# Patient Record
Sex: Female | Born: 1935 | Race: White | Hispanic: No | Marital: Married
Health system: Midwestern US, Academic
[De-identification: ages and names within clinical notes are randomized; demographics above are authoritative.]

---

## 2016-11-22 IMAGING — CR LOW_EXM
3 series · 3 of 3 positions shown · non-contrast
Comparison: 20 October, 2013

EXAM: Left knee
INDICATION: Left knee pain
TECHNIQUE: Three-view

[knee ap]
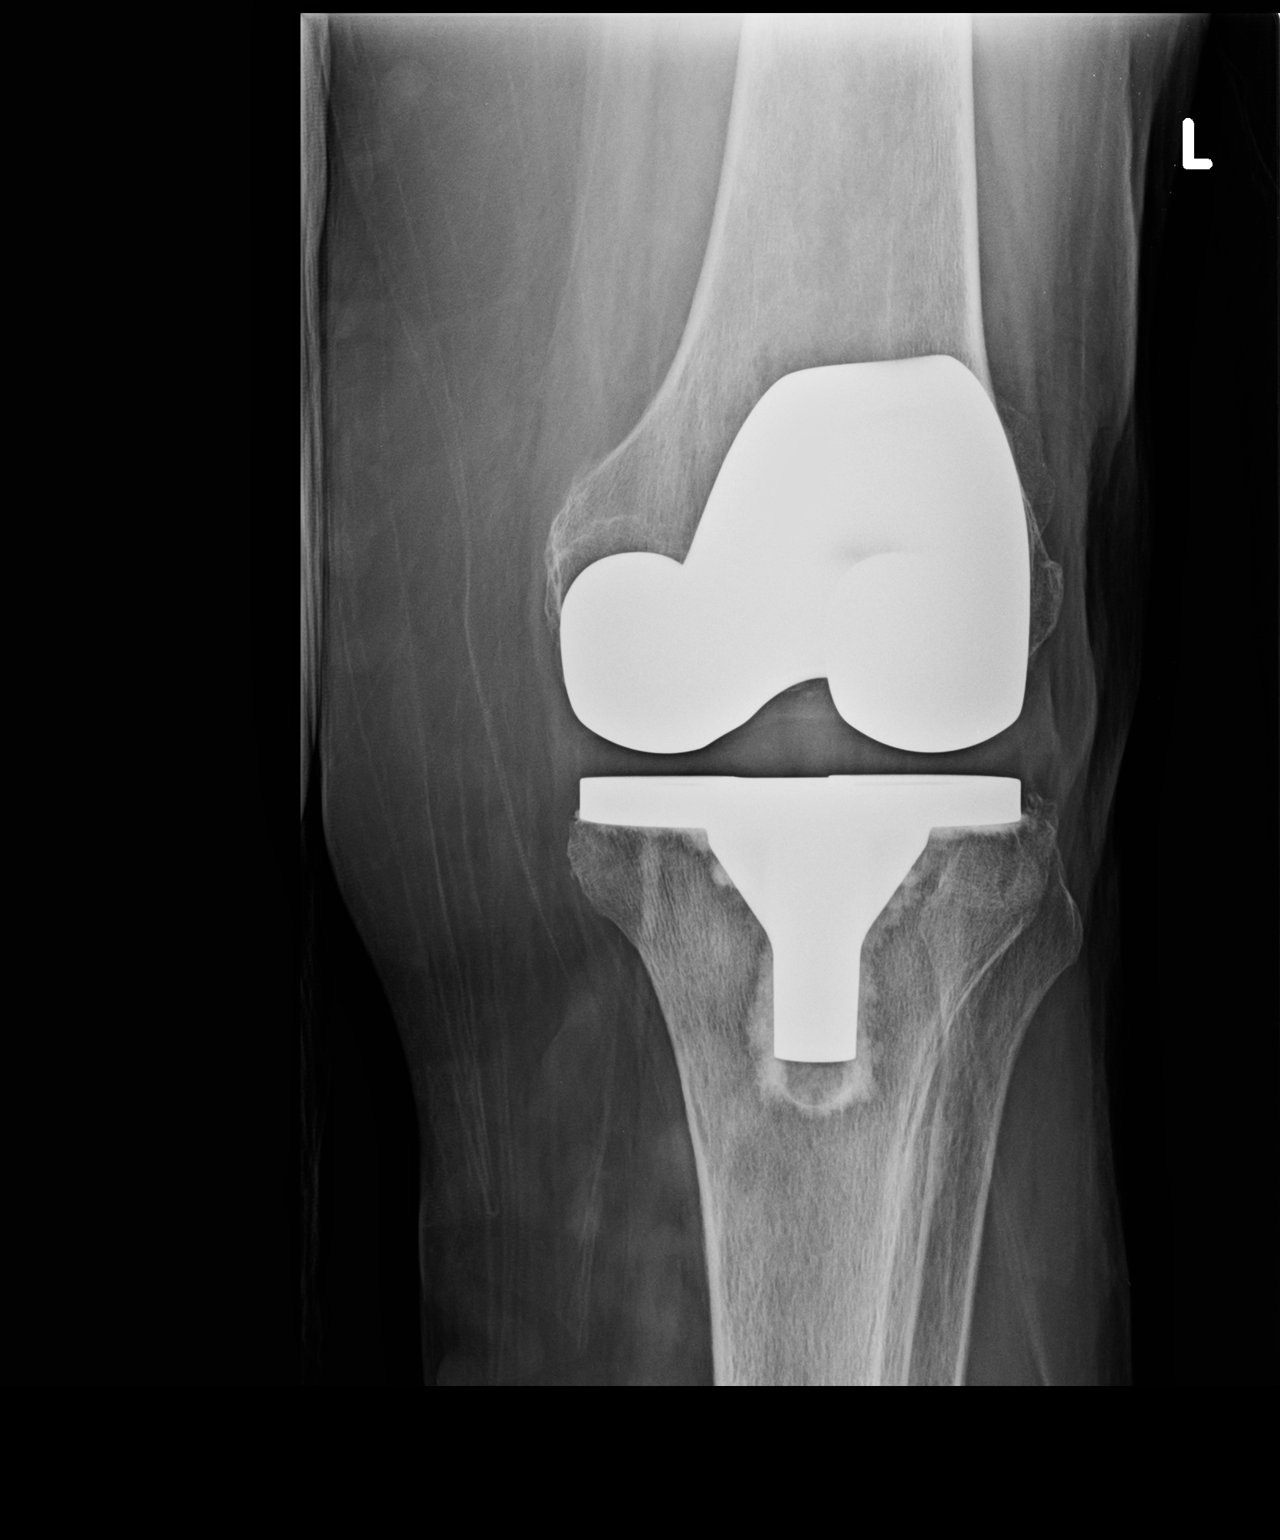

[knee lat]
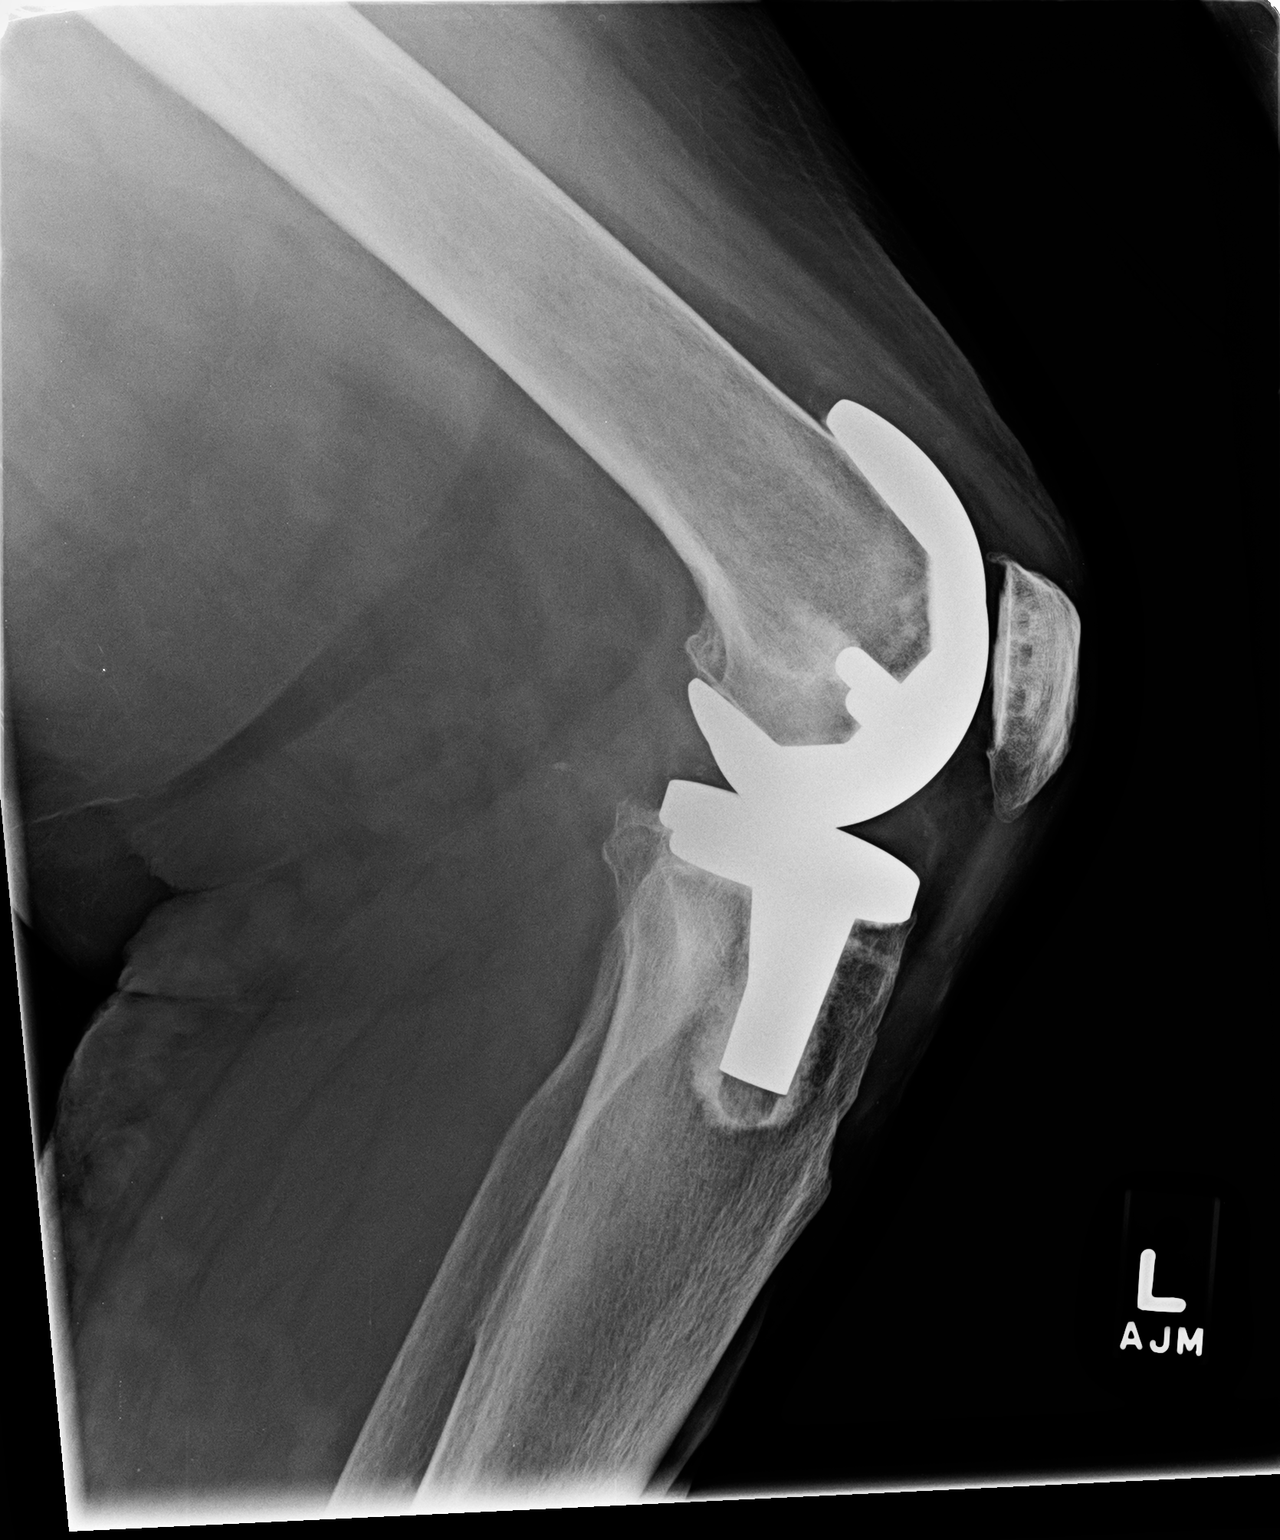

[knee sunrise]
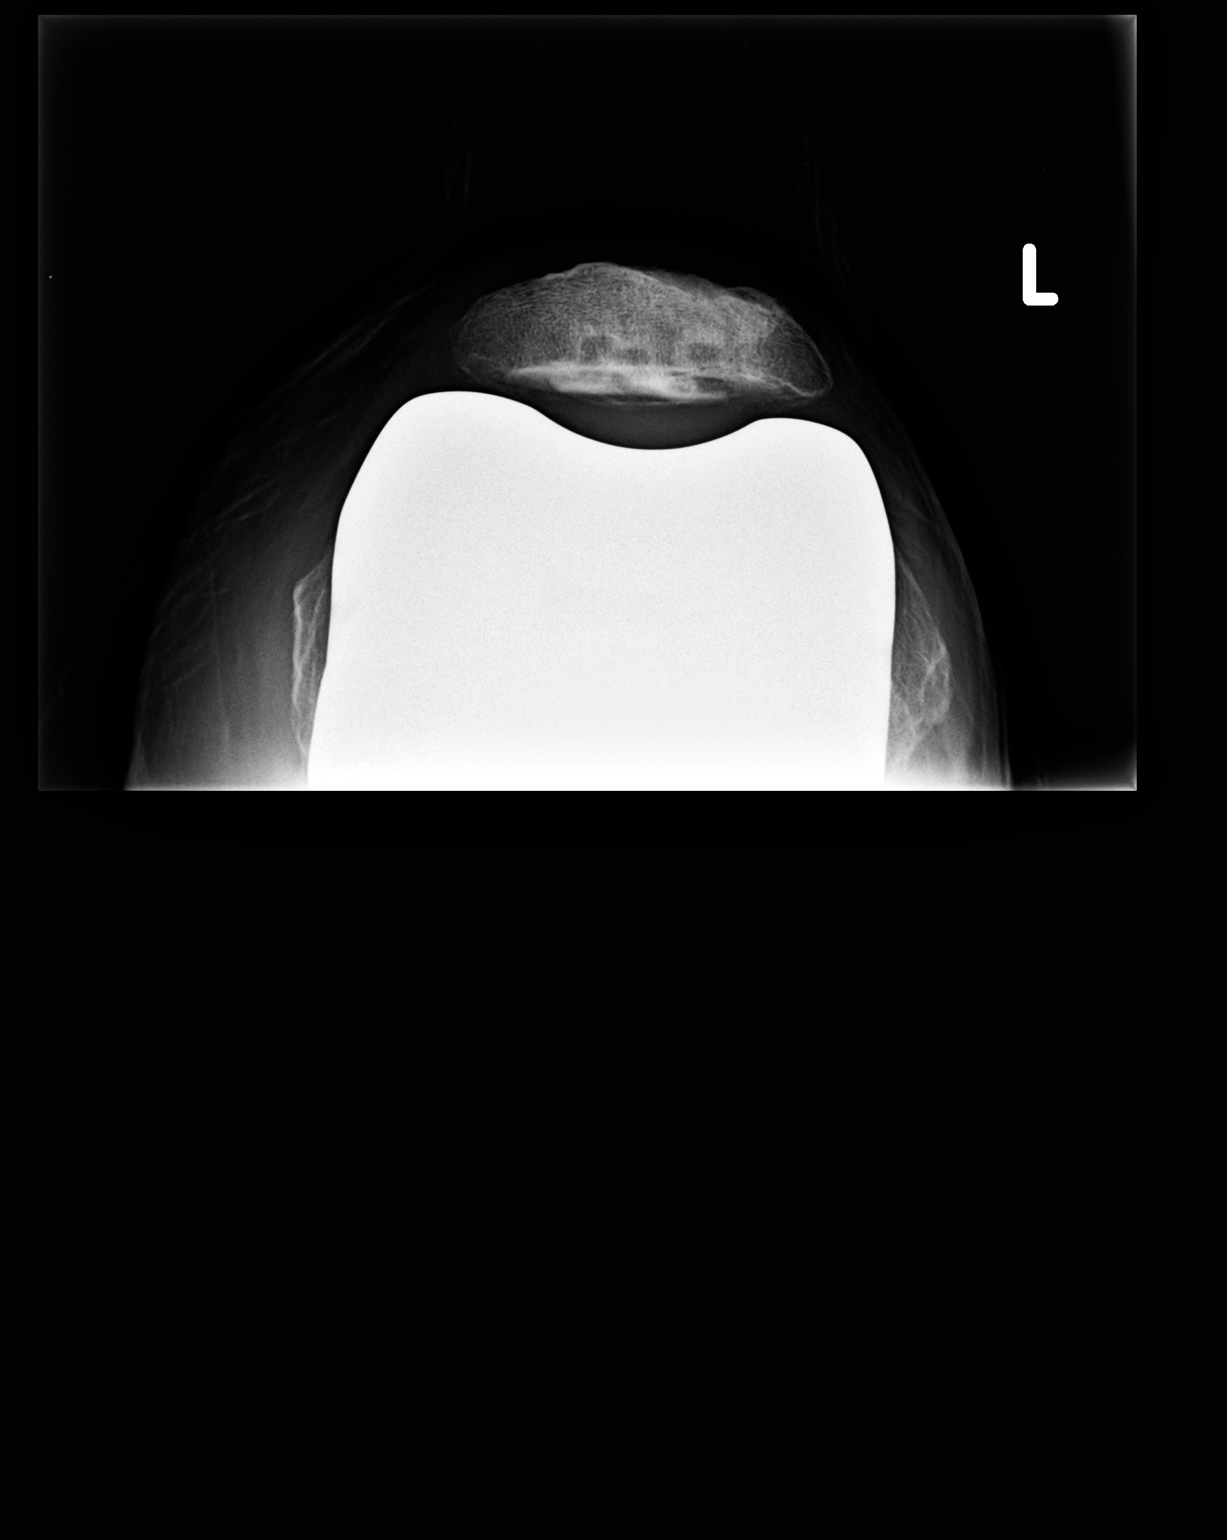

[3 of 3 positions shown; findings below may reference images not displayed]

IMPRESSION: Total left knee arthroplasty without acute bony or soft tissue abnormality.
FINDINGS: There is a total left knee prosthesis.  Hardware is stable in position and
alignment.  No signs for hardware loosening.  No effusion.  No fracture.
Patella is midline.

Dictated by Rigo, Clint
Preliminary report until reviewed and verified by Up, Ahmada

Tech Notes: KNEE PAIN X 1 MONTH AFTER PULLING WEEDS. PAIN RADIATES UP TO HIP. AB

## 2016-11-22 IMAGING — CR PELVIS
2 series · 2 of 2 positions shown · non-contrast
Comparison: None

EXAM: Left hip
INDICATION: Left hip pain
TECHNIQUE: AP, lateral

[hip ap]
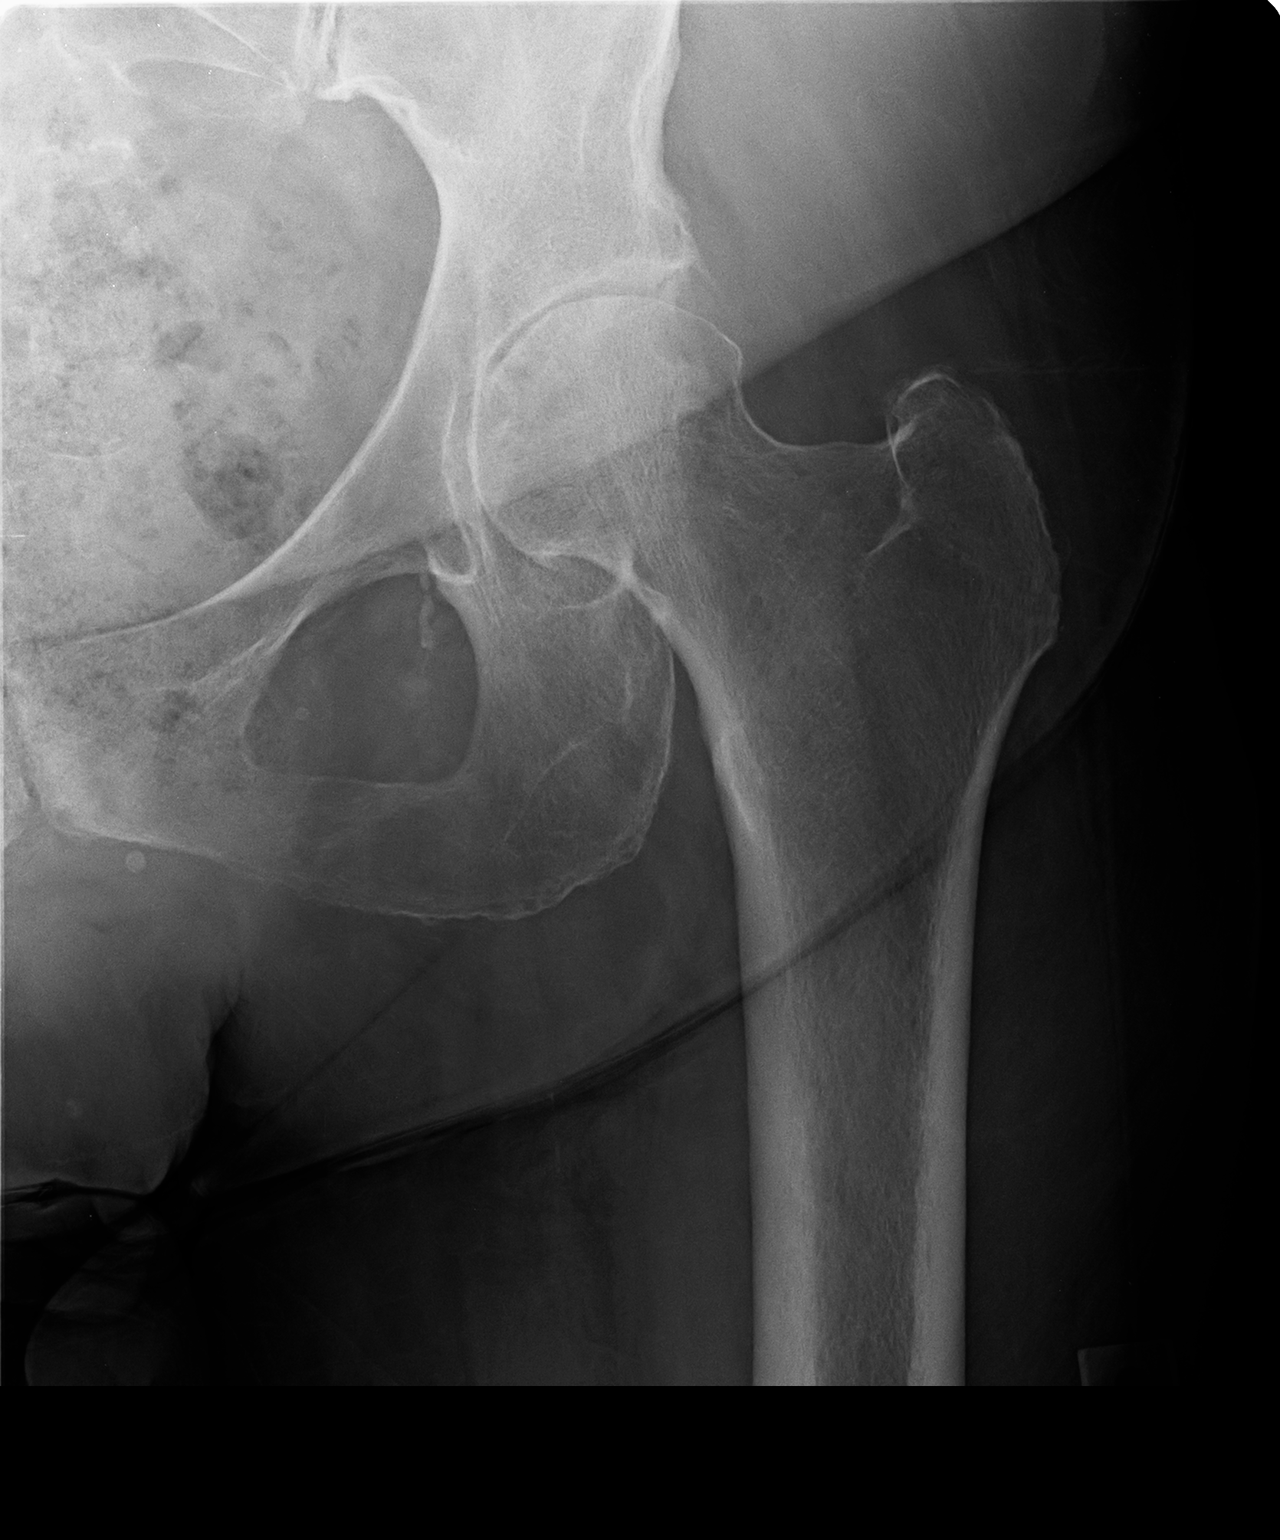

[hip frog]
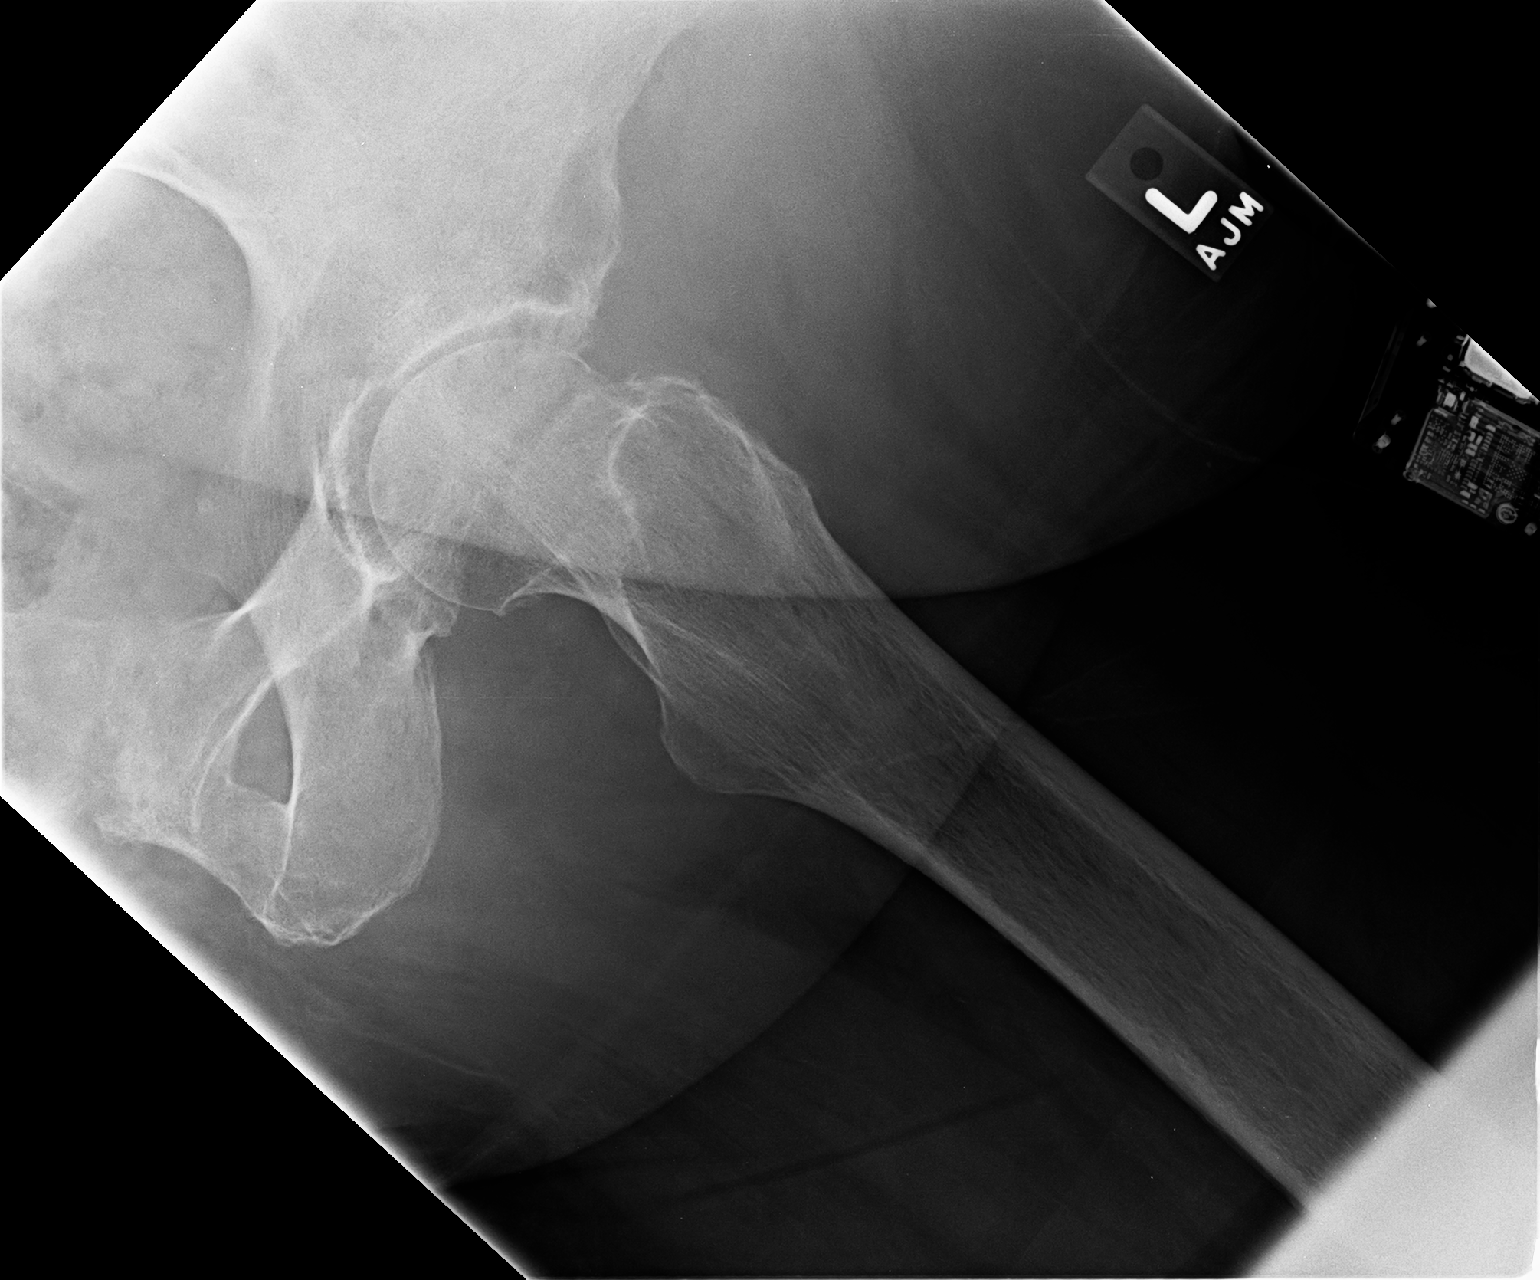

[2 of 2 positions shown; findings below may reference images not displayed]

IMPRESSION: Moderate osteoarthritic degenerative change without acute bony or soft tissue
abnormality.
FINDINGS: There is loss of joint space height.  There is mild osteophyte formation
superior lateral acetabulum.  No fracture.  No dislocation.  Visualized left
hemipelvis is unremarkable.  Soft tissues unremarkable.

Dictated by Nelson, Ihuaku
Preliminary report until reviewed and verified by Arslan, Combe

Tech Notes: KNEE PAIN X 1 MONTH AFTER PULLING WEEDS. PAIN RADIATES UP TO HIP. AB

## 2016-12-26 IMAGING — MR Hips^ROUTINE
6 series · 42 of 48 positions shown · non-contrast
Comparison: Plain film February 06, 2015.

MRI REPORT

MRI LEFT HIP
INDICATIONS:  Atypical left hip pain.
TECHNIQUE: Multiple standard sagittal, axial and coronal reference planes
without contrast.

[Series 2: T1 · coronal · 4.0mm · 0.78mm/px · 8 of 24 slices shown (1 of 4)]
[im 1/24]
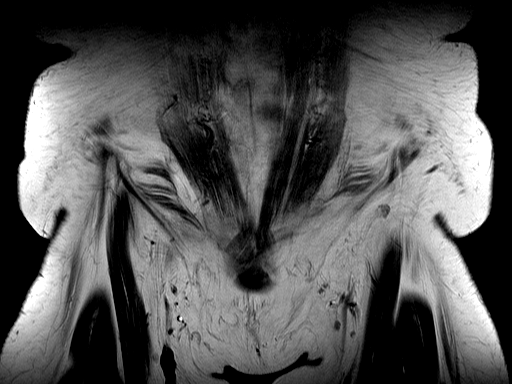
[im 4/24]
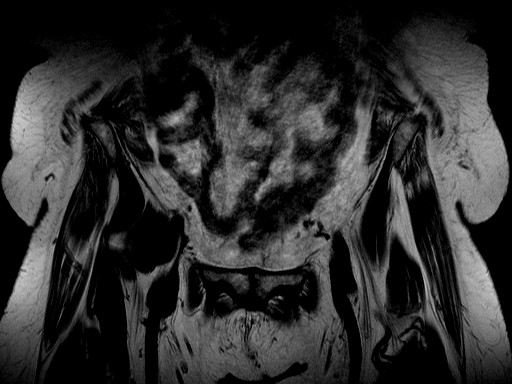
[im 7/24]
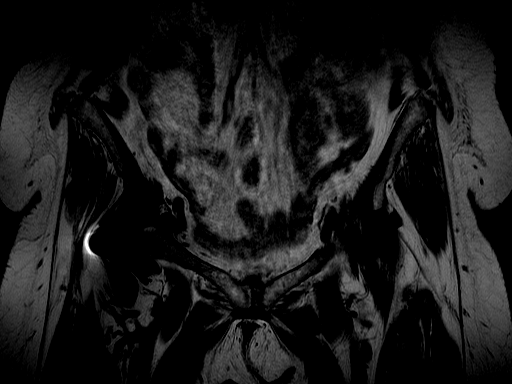
[im 10/24]
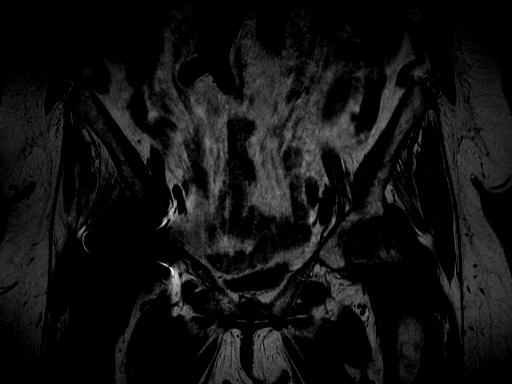
[im 14/24]
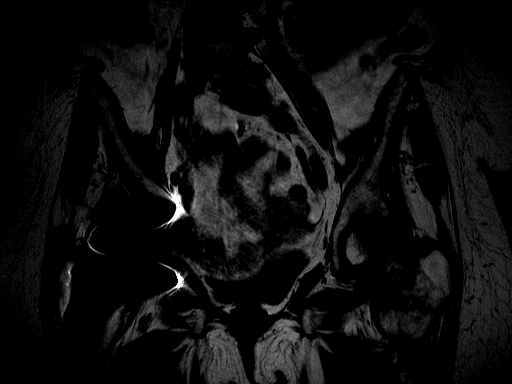
[im 17/24]
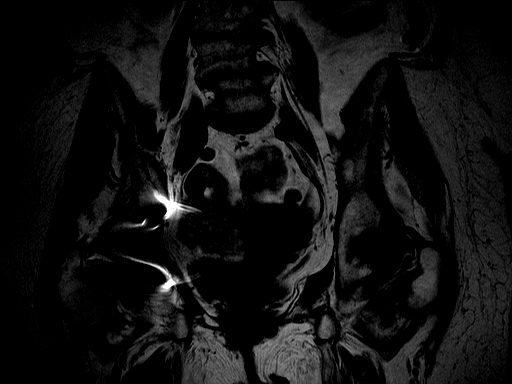
[im 20/24]
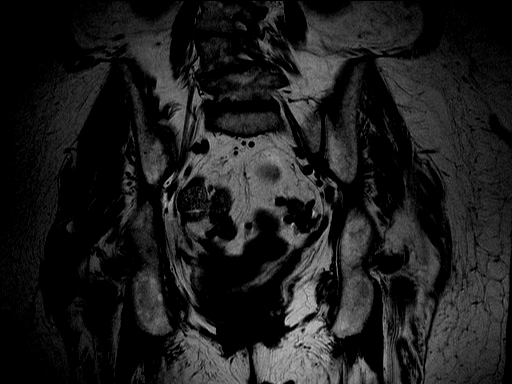
[im 24/24]
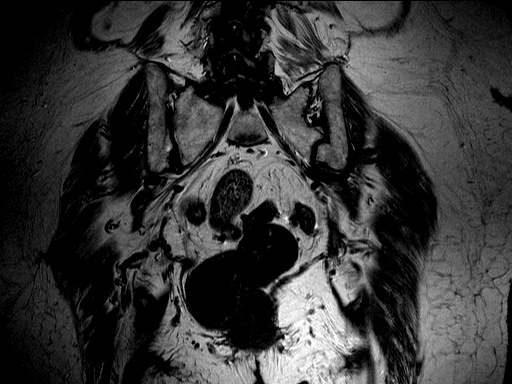

[Series 3: STIR · coronal · 4.0mm · 1.25mm/px · 8 of 24 slices shown (1 of 2)]
[im 1/24]
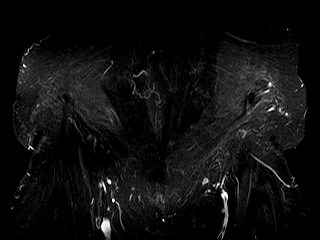
[im 4/24]
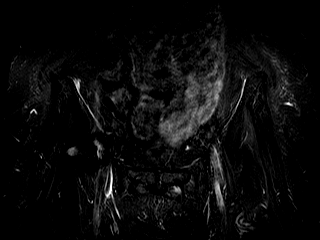
[im 7/24]
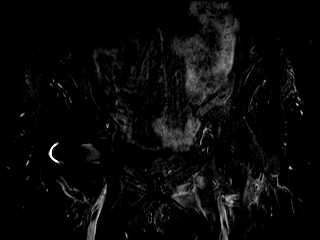
[im 10/24]
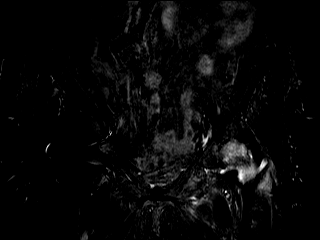
[im 14/24]
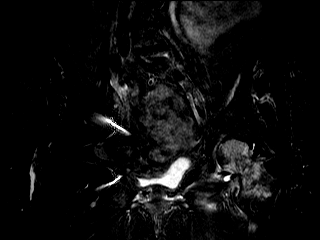
[im 17/24]
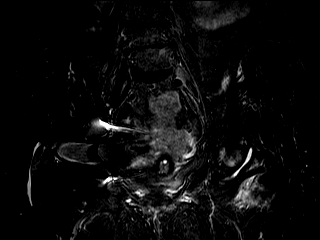
[im 20/24]
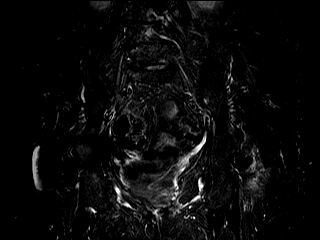
[im 24/24]
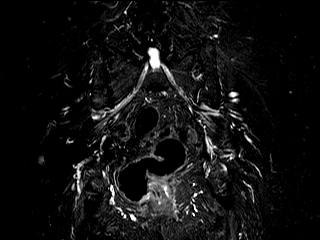

[Series 4: T1 · axial · 4.0mm · 0.70mm/px · z∈[-129,-14]mm · 8 of 24 slices shown (2 of 4)]
[im 1/24]
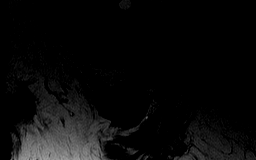
[im 4/24]
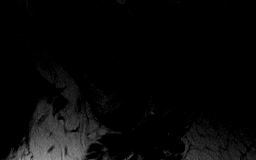
[im 7/24]
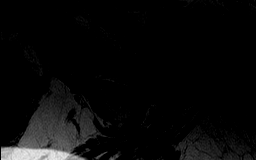
[im 10/24]
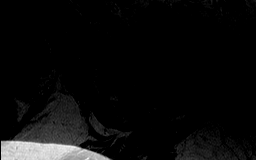
[im 14/24]
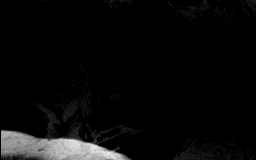
[im 17/24]
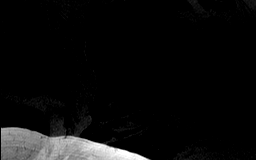
[im 20/24]
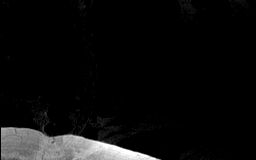
[im 24/24]
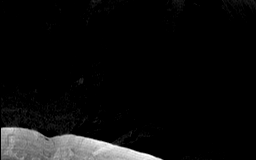

[Series 5: T1 · sagittal · 4.0mm · 0.69mm/px · 8 of 24 slices shown (3 of 4)]
[im 1/24]
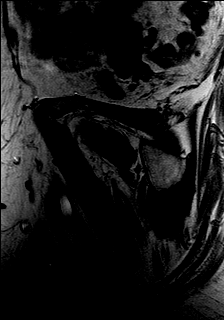
[im 4/24]
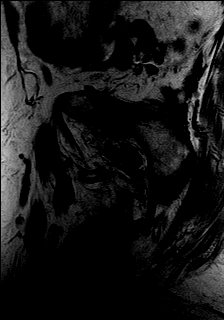
[im 7/24]
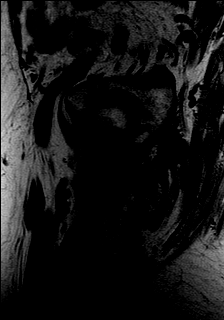
[im 10/24]
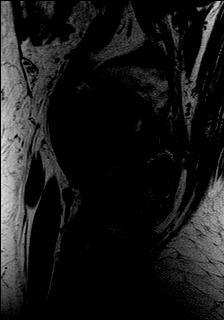
[im 14/24]
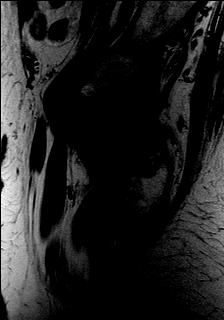
[im 17/24]
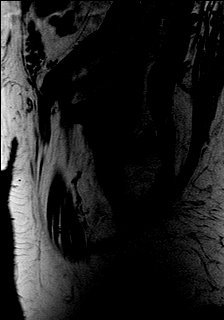
[im 20/24]
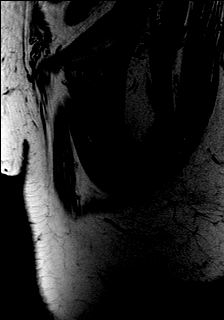
[im 24/24]
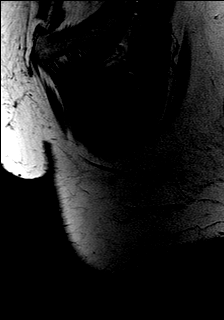

[Series 6: STIR · axial · 4.0mm · 0.68mm/px · z∈[-133,-118]mm · 2 of 23 slices shown (2 of 2)]
[im 1/23]
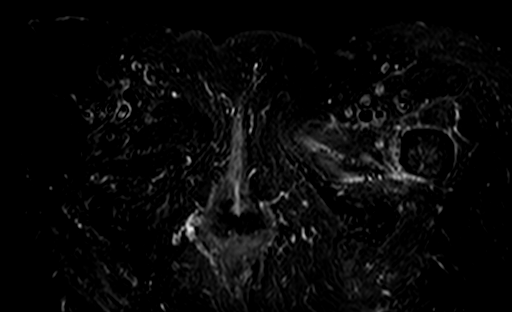
[im 4/23]
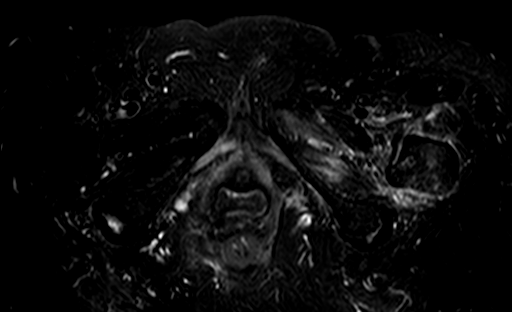

[Series 7: T1 · axial · 4.0mm · 0.68mm/px · z∈[-133,-23]mm · 8 of 23 slices shown (4 of 4)]
[im 1/23]
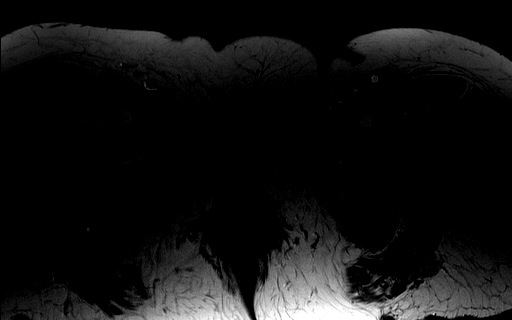
[im 4/23]
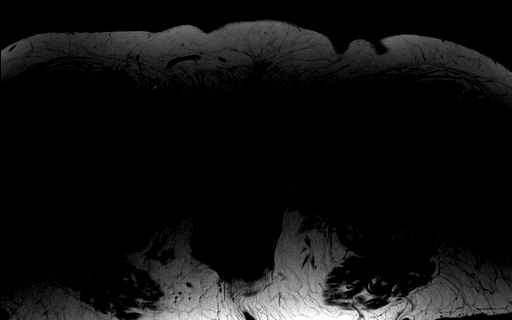
[im 7/23]
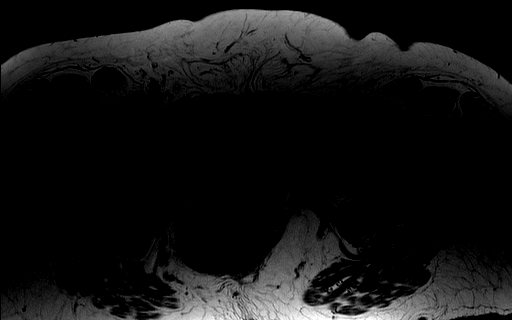
[im 10/23]
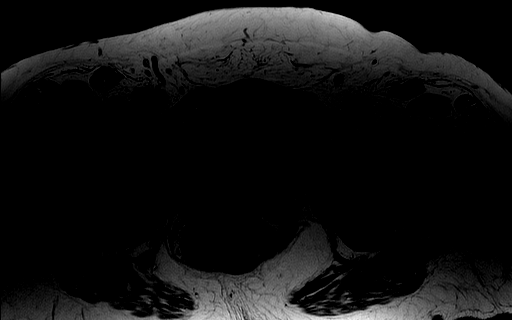
[im 13/23]
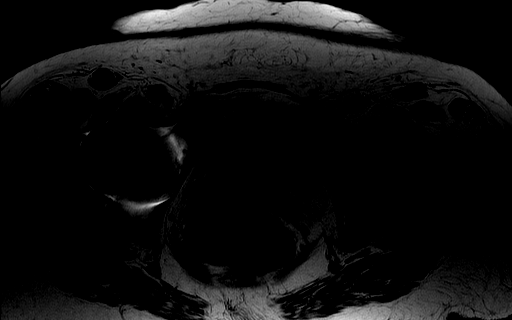
[im 16/23]
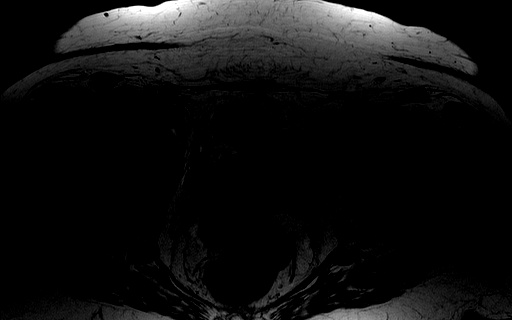
[im 19/23]
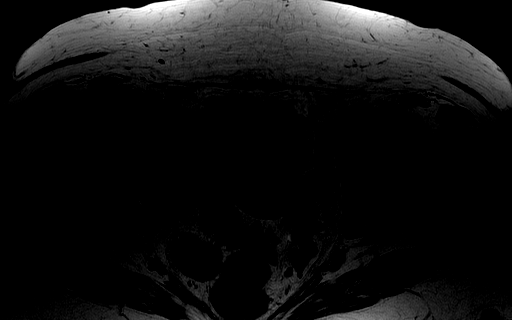
[im 23/23]
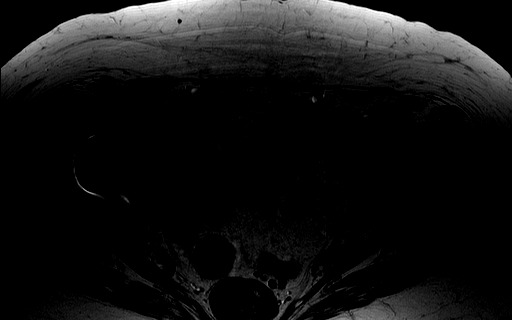

[42 of 48 positions shown; findings below may reference images not displayed]

IMPRESSION: Marked edema with micro fractures of the left femoral head, femoral neck,
base of the neck and medial aspect of the intertrochanteric region without
acute displaced fracture.
Exam discussed with Loaisa, Critian at [DATE] p.m.
FINDINGS: There is saturation from a right total hip replacement.
There is extensive patchy heterogeneity and loss of marrow fat of the femoral
head, femoral neck, base of the femoral neck and the medial aspect of the
intertrochanteric region.
There is preservation of the normal marrow fat of the greater trochanter and
lesser trochanter.
On the T2 weighted images there is extensive edema throughout this same
region.
There is edema of the adjacent soft tissues.
There is a small left hip effusion.
Multiple small micro fractures are suggested.
No displaced or through-and-through fracture is seen at this point.
The marrow signal of the rest of the pelvis and lumbar spine is physiologic.
There is advanced degenerative disc disease, mild-to-moderate dextroscoliosis
and spondylosis of the demonstrated lumbar spine.

Job: [id number]

Tech Notes: Hip pain, no known injury.
BG

## 2017-10-15 IMAGING — CR CHEST
4 series · 4 of 4 positions shown · non-contrast
Comparison: none

[shoulder external]
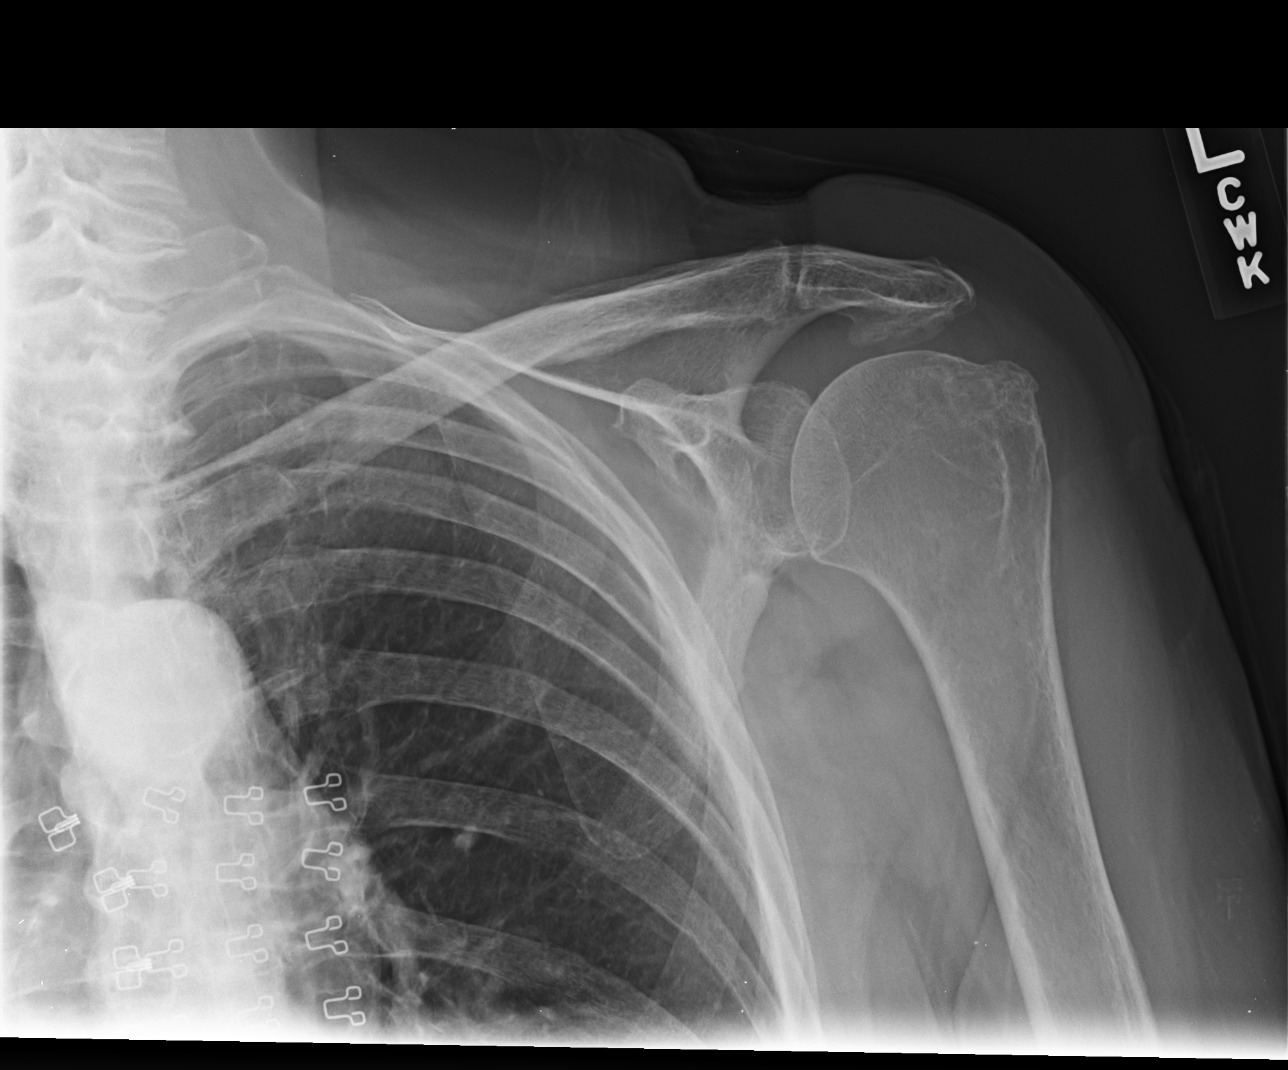

[shoulder internal]
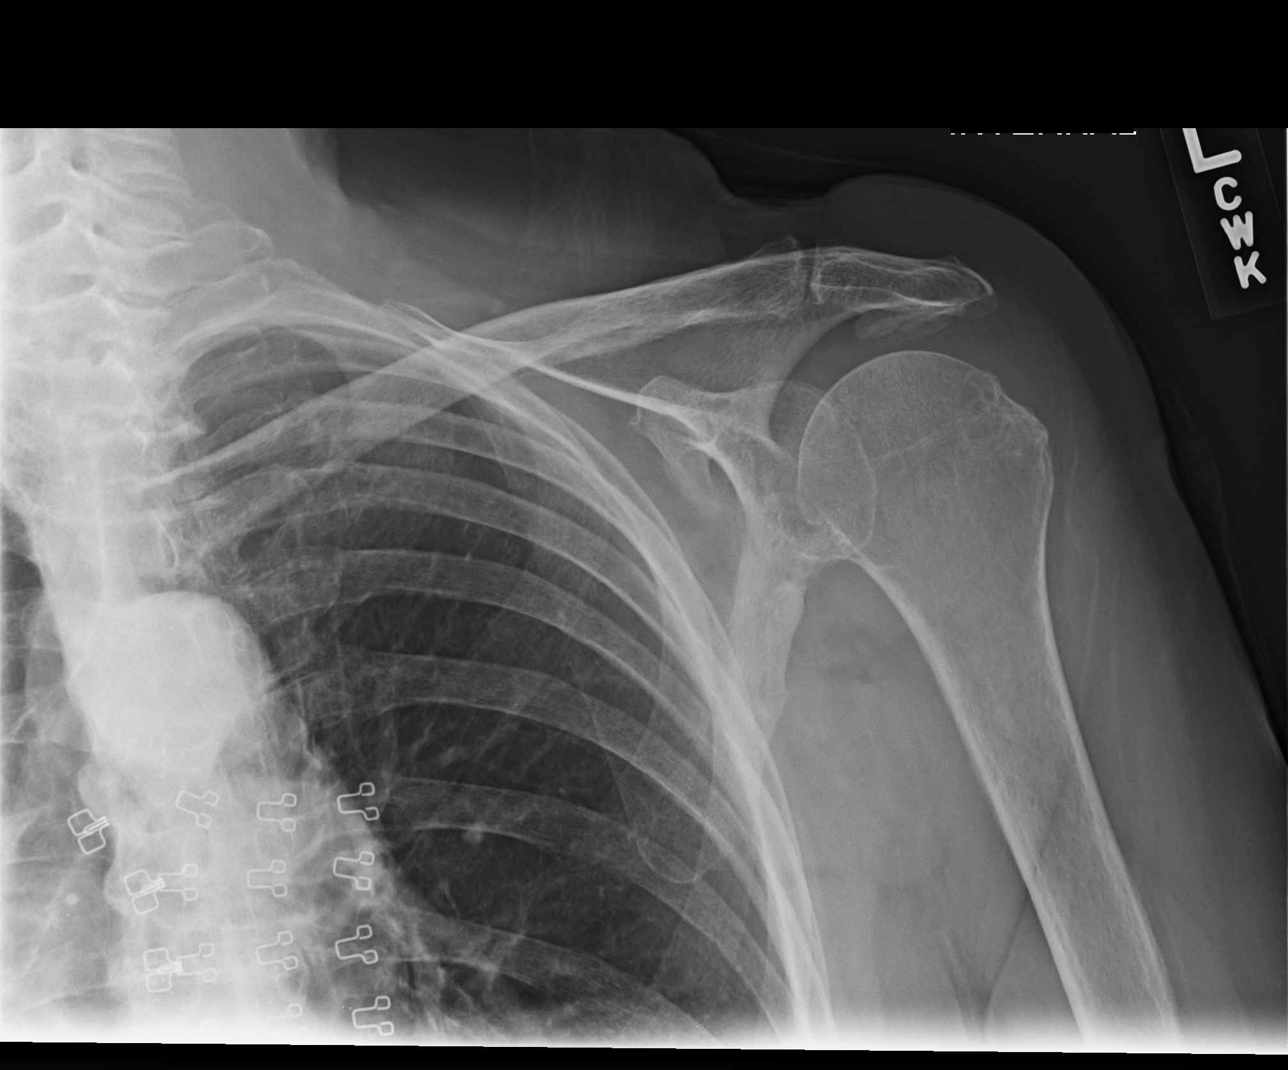

[shoulder y-view]
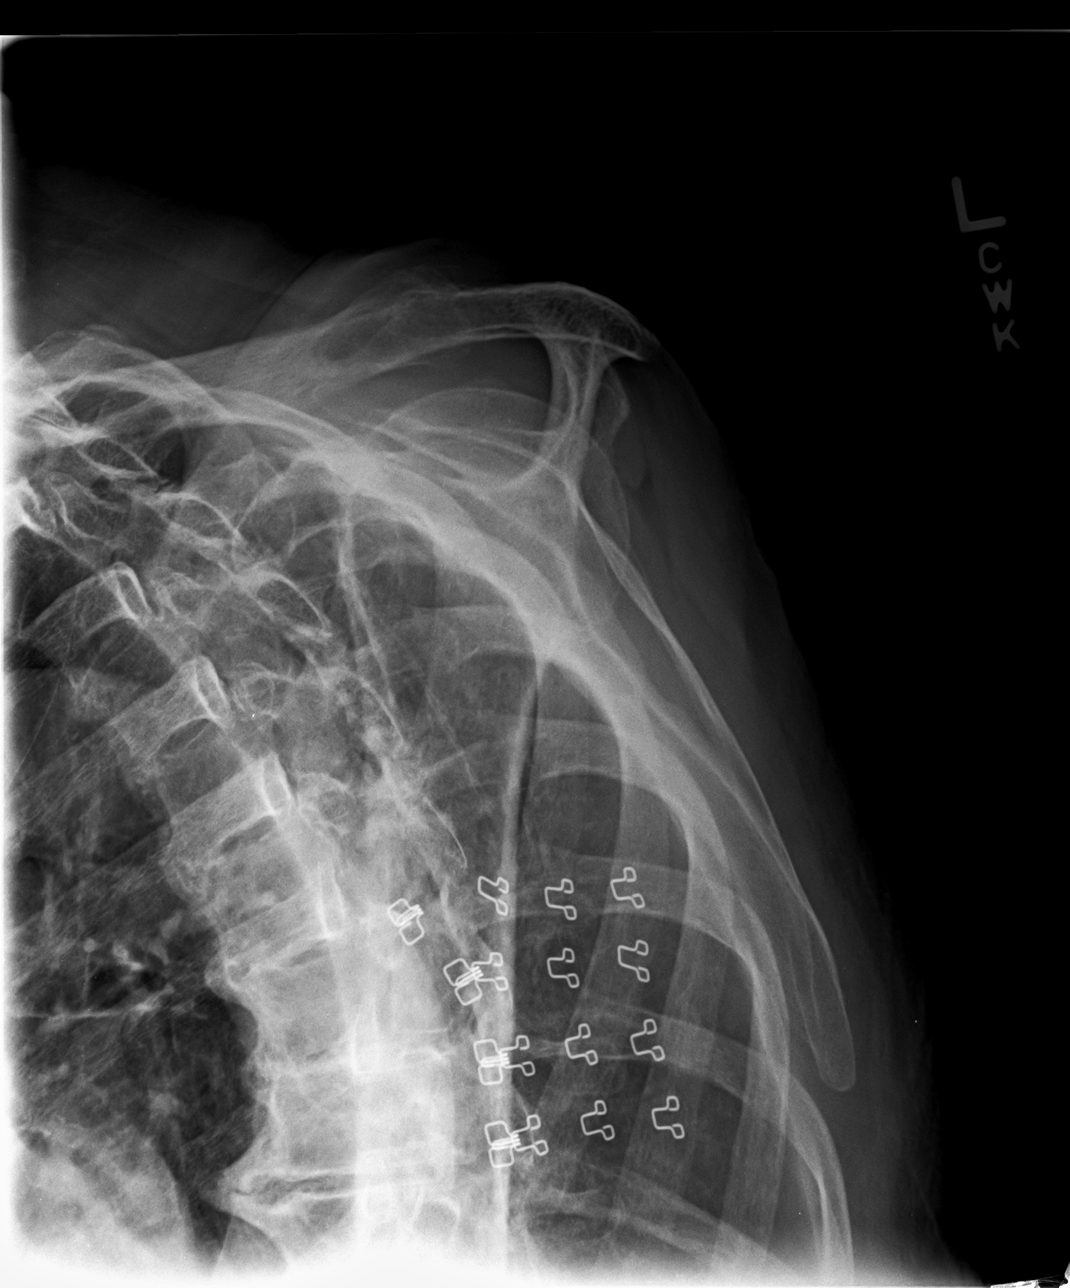

[shoulder axillary]
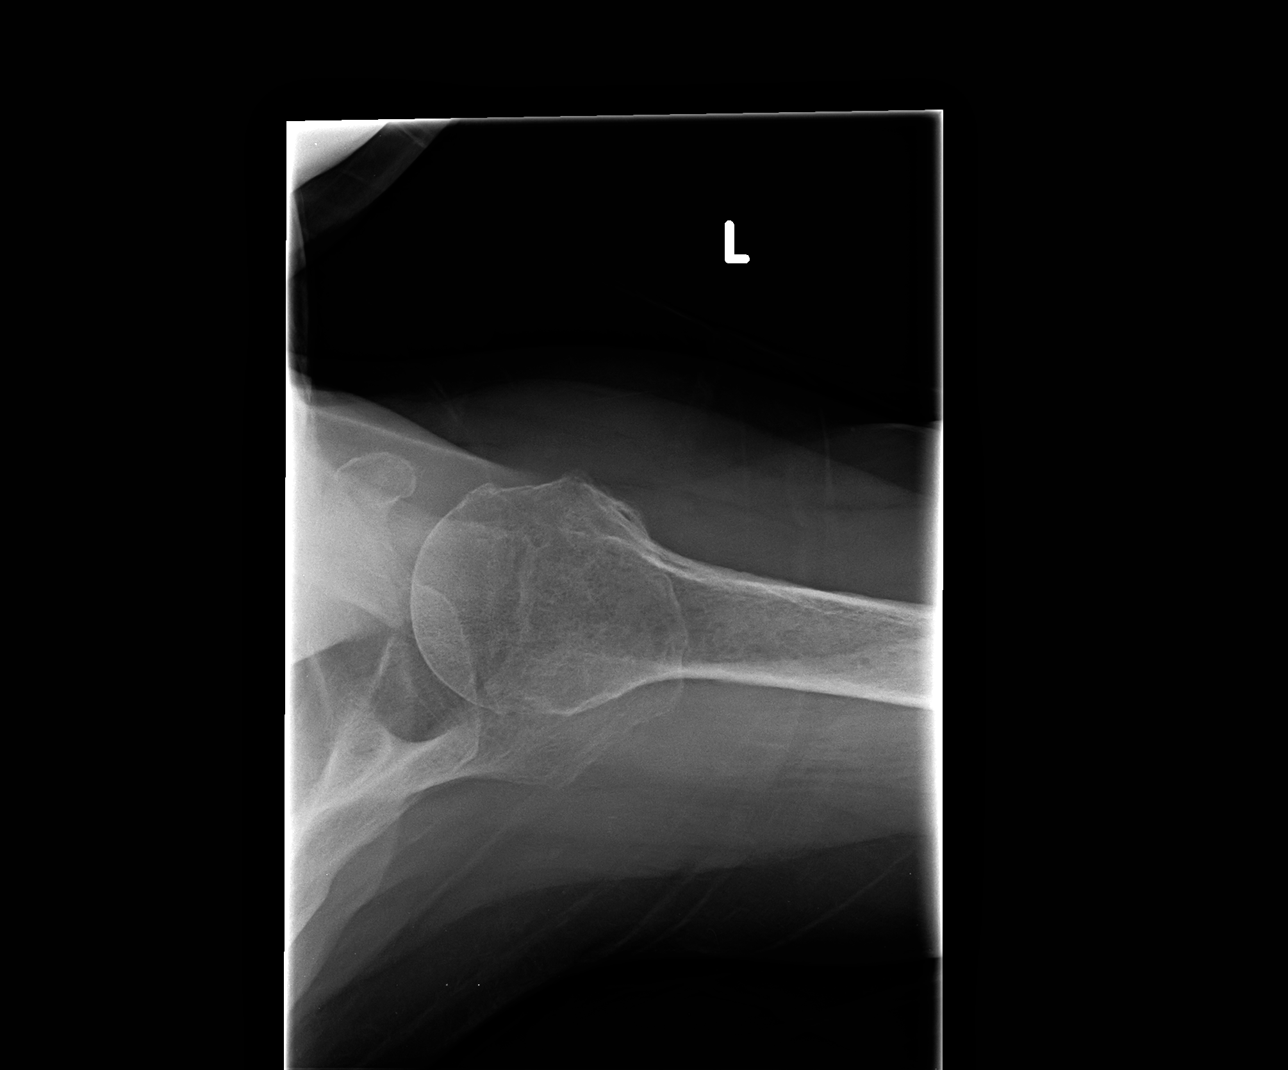

[4 of 4 positions shown; findings below may reference images not displayed]

DIAGNOSTIC STUDIES

EXAM
RADIOLOGICAL EXAMINATIONLEFTSHOULDER; COMPLETE, MINIMUM 2 VIEWS CPT 58585

INDICATION
L shoulder painPT. C/O CHRONIC LEFT SHOULDER PAIN. CK

TECHNIQUE
0views of the left shoulder were acquired.

COMPARISONS
None available

FINDINGS
There is no evidence of fracture or dislocation. The articulations appear to be notable for
moderate osteoarthritic degenerative changes. Prominent under spurring of the acromion noted. . The
soft tissues are normal.

IMPRESSION
Prominent acromial undersurface spurring suggesting rotator cuff impingement. There is clinical
evidence of impingement syndrome MRI would be recommended.

## 2019-04-25 ENCOUNTER — Encounter: Admit: 2019-04-25 | Discharge: 2019-04-25

## 2019-04-26 ENCOUNTER — Encounter: Admit: 2019-04-26 | Discharge: 2019-04-26

## 2019-04-26 DIAGNOSIS — G4700 Insomnia, unspecified: Secondary | ICD-10-CM

## 2019-04-26 DIAGNOSIS — F419 Anxiety disorder, unspecified: Secondary | ICD-10-CM

## 2019-04-26 DIAGNOSIS — K219 Gastro-esophageal reflux disease without esophagitis: Secondary | ICD-10-CM

## 2019-04-26 DIAGNOSIS — E7800 Pure hypercholesterolemia, unspecified: Secondary | ICD-10-CM

## 2019-04-26 DIAGNOSIS — N3281 Overactive bladder: Secondary | ICD-10-CM

## 2019-04-26 DIAGNOSIS — I10 Essential (primary) hypertension: Secondary | ICD-10-CM

## 2019-04-26 DIAGNOSIS — K5790 Diverticulosis of intestine, part unspecified, without perforation or abscess without bleeding: Secondary | ICD-10-CM

## 2019-04-26 NOTE — Telephone Encounter
04/26/19 - Records have been requested STAT from PCP listed in chart and [facility] / [facility]

## 2019-04-27 ENCOUNTER — Encounter: Admit: 2019-04-27 | Discharge: 2019-04-27 | Payer: MEDICARE

## 2019-04-27 DIAGNOSIS — G4700 Insomnia, unspecified: Secondary | ICD-10-CM

## 2019-04-27 DIAGNOSIS — I517 Cardiomegaly: Secondary | ICD-10-CM

## 2019-04-27 DIAGNOSIS — I453 Trifascicular block: Secondary | ICD-10-CM

## 2019-04-27 DIAGNOSIS — F419 Anxiety disorder, unspecified: Secondary | ICD-10-CM

## 2019-04-27 DIAGNOSIS — R7309 Other abnormal glucose: Secondary | ICD-10-CM

## 2019-04-27 DIAGNOSIS — I10 Essential (primary) hypertension: Secondary | ICD-10-CM

## 2019-04-27 DIAGNOSIS — C4490 Unspecified malignant neoplasm of skin, unspecified: Secondary | ICD-10-CM

## 2019-04-27 DIAGNOSIS — K219 Gastro-esophageal reflux disease without esophagitis: Secondary | ICD-10-CM

## 2019-04-27 DIAGNOSIS — E7800 Pure hypercholesterolemia, unspecified: Secondary | ICD-10-CM

## 2019-04-27 DIAGNOSIS — R0789 Other chest pain: Secondary | ICD-10-CM

## 2019-04-27 DIAGNOSIS — I493 Ventricular premature depolarization: Secondary | ICD-10-CM

## 2019-04-27 DIAGNOSIS — N3281 Overactive bladder: Secondary | ICD-10-CM

## 2019-04-27 DIAGNOSIS — K5790 Diverticulosis of intestine, part unspecified, without perforation or abscess without bleeding: Secondary | ICD-10-CM

## 2019-04-27 MED ORDER — NITROGLYCERIN 0.4 MG SL SUBL
.4 mg | ORAL_TABLET | SUBLINGUAL | 3 refills | 9.00000 days | Status: AC | PRN
Start: 2019-04-27 — End: ?

## 2019-05-04 ENCOUNTER — Encounter: Admit: 2019-05-04 | Discharge: 2019-05-04 | Payer: MEDICARE

## 2019-05-04 DIAGNOSIS — E7800 Pure hypercholesterolemia, unspecified: Secondary | ICD-10-CM

## 2019-05-04 DIAGNOSIS — I10 Essential (primary) hypertension: Secondary | ICD-10-CM

## 2019-05-04 DIAGNOSIS — I493 Ventricular premature depolarization: Secondary | ICD-10-CM

## 2019-05-04 DIAGNOSIS — I517 Cardiomegaly: Secondary | ICD-10-CM

## 2019-05-04 DIAGNOSIS — I453 Trifascicular block: Secondary | ICD-10-CM

## 2019-05-04 DIAGNOSIS — R0789 Other chest pain: Secondary | ICD-10-CM

## 2019-05-04 MED ORDER — ROSUVASTATIN 10 MG PO TAB
10 mg | ORAL_TABLET | Freq: Every day | ORAL | 3 refills | 90.00000 days | Status: DC
Start: 2019-05-04 — End: 2019-06-08

## 2019-05-04 NOTE — Telephone Encounter
Left message with lab results and recommendations from JST to start crestor 10 mg daily and call back number for further concerns or questions. Orders entered and sent to [facility].

## 2019-05-04 NOTE — Telephone Encounter
-----   Message from Jamison Neighbor, MD sent at 05/04/2019 10:32 AM CST -----  Add 10 crestor for dyslipidemia.   Thanks  ----- Message -----  From: Katina Degree, RN  Sent: 05/04/2019   8:13 AM CST  To: Jamison Neighbor, MD    Lab results for your review and recommendations. LDL elevated at 132.

## 2019-05-10 ENCOUNTER — Encounter: Admit: 2019-05-10 | Discharge: 2019-05-10 | Payer: MEDICARE

## 2019-05-10 NOTE — Telephone Encounter
Called to set up zio placement or for zio to be sent to [place]

## 2019-05-17 ENCOUNTER — Encounter: Admit: 2019-05-17 | Discharge: 2019-05-17 | Payer: MEDICARE

## 2019-05-17 NOTE — Progress Notes
Long Term Monitor Placement Record  Ordering Physician: JST   Diagnosis: PVC  Length: 7 days  Home enrollment  Address and insurance verified with patient. BC

## 2019-05-22 ENCOUNTER — Encounter: Admit: 2019-05-22 | Discharge: 2019-05-22 | Payer: MEDICARE

## 2019-05-22 DIAGNOSIS — N3281 Overactive bladder: Secondary | ICD-10-CM

## 2019-05-22 DIAGNOSIS — F419 Anxiety disorder, unspecified: Secondary | ICD-10-CM

## 2019-05-22 DIAGNOSIS — E7800 Pure hypercholesterolemia, unspecified: Secondary | ICD-10-CM

## 2019-05-22 DIAGNOSIS — G4700 Insomnia, unspecified: Secondary | ICD-10-CM

## 2019-05-22 DIAGNOSIS — C4490 Unspecified malignant neoplasm of skin, unspecified: Secondary | ICD-10-CM

## 2019-05-22 DIAGNOSIS — K5790 Diverticulosis of intestine, part unspecified, without perforation or abscess without bleeding: Secondary | ICD-10-CM

## 2019-05-22 DIAGNOSIS — I10 Essential (primary) hypertension: Secondary | ICD-10-CM

## 2019-05-22 DIAGNOSIS — K219 Gastro-esophageal reflux disease without esophagitis: Secondary | ICD-10-CM

## 2019-05-23 ENCOUNTER — Encounter: Admit: 2019-05-23 | Discharge: 2019-05-23 | Payer: MEDICARE

## 2019-05-23 ENCOUNTER — Ambulatory Visit: Admit: 2019-05-23 | Discharge: 2019-05-23 | Payer: MEDICARE

## 2019-05-23 DIAGNOSIS — E7800 Pure hypercholesterolemia, unspecified: Secondary | ICD-10-CM

## 2019-05-23 DIAGNOSIS — I453 Trifascicular block: Secondary | ICD-10-CM

## 2019-05-23 DIAGNOSIS — I10 Essential (primary) hypertension: Secondary | ICD-10-CM

## 2019-05-23 DIAGNOSIS — R079 Chest pain, unspecified: Secondary | ICD-10-CM

## 2019-05-23 DIAGNOSIS — I517 Cardiomegaly: Secondary | ICD-10-CM

## 2019-05-24 ENCOUNTER — Encounter

## 2019-05-24 DIAGNOSIS — I10 Essential (primary) hypertension: Secondary | ICD-10-CM

## 2019-05-24 DIAGNOSIS — E7800 Pure hypercholesterolemia, unspecified: Secondary | ICD-10-CM

## 2019-05-24 DIAGNOSIS — R079 Chest pain, unspecified: Secondary | ICD-10-CM

## 2019-05-24 DIAGNOSIS — I493 Ventricular premature depolarization: Secondary | ICD-10-CM

## 2019-06-07 NOTE — Progress Notes
Called [employer] about pending zio results, monitor received on jan 15 (confirmed w/ company). asked to expedite results for appointment tomorrow

## 2019-06-08 ENCOUNTER — Encounter: Admit: 2019-06-08 | Discharge: 2019-06-08 | Payer: MEDICARE

## 2019-06-08 DIAGNOSIS — I10 Essential (primary) hypertension: Secondary | ICD-10-CM

## 2019-06-08 DIAGNOSIS — N3281 Overactive bladder: Secondary | ICD-10-CM

## 2019-06-08 DIAGNOSIS — I517 Cardiomegaly: Secondary | ICD-10-CM

## 2019-06-08 DIAGNOSIS — E7800 Pure hypercholesterolemia, unspecified: Secondary | ICD-10-CM

## 2019-06-08 DIAGNOSIS — G4700 Insomnia, unspecified: Secondary | ICD-10-CM

## 2019-06-08 DIAGNOSIS — R0789 Other chest pain: Secondary | ICD-10-CM

## 2019-06-08 DIAGNOSIS — F419 Anxiety disorder, unspecified: Secondary | ICD-10-CM

## 2019-06-08 DIAGNOSIS — K219 Gastro-esophageal reflux disease without esophagitis: Secondary | ICD-10-CM

## 2019-06-08 DIAGNOSIS — C4490 Unspecified malignant neoplasm of skin, unspecified: Secondary | ICD-10-CM

## 2019-06-08 DIAGNOSIS — K5790 Diverticulosis of intestine, part unspecified, without perforation or abscess without bleeding: Secondary | ICD-10-CM

## 2019-06-08 NOTE — Patient Instructions
1. Med changes today:  None.  2. Please write down your weight, blood pressure, and heart rate daily and bring this log to all clinic visits.  3. Labs with your primary care doctor later in 2021.  4. Return to see Dr. Adah Perl in 1 year, but call us if you need to be seen before next visit.    My nurse's Telford Nab and Roselyn Reef) number is [phone].  Please call the office with any questions or concerns [phone] (nurse triage).  To schedule or change an appointment call [phone].    Lise Auer, MD, PhD  [facility] [place] [facility] Phone: [phone] Fax: [phone]

## 2019-07-09 ENCOUNTER — Encounter: Admit: 2019-07-09 | Discharge: 2019-07-09 | Payer: MEDICARE

## 2019-07-09 DIAGNOSIS — E7800 Pure hypercholesterolemia, unspecified: Secondary | ICD-10-CM

## 2019-07-09 DIAGNOSIS — K219 Gastro-esophageal reflux disease without esophagitis: Secondary | ICD-10-CM

## 2019-07-09 DIAGNOSIS — I10 Essential (primary) hypertension: Secondary | ICD-10-CM

## 2019-07-09 DIAGNOSIS — G4700 Insomnia, unspecified: Secondary | ICD-10-CM

## 2019-07-09 DIAGNOSIS — K5790 Diverticulosis of intestine, part unspecified, without perforation or abscess without bleeding: Secondary | ICD-10-CM

## 2019-07-09 DIAGNOSIS — C4490 Unspecified malignant neoplasm of skin, unspecified: Secondary | ICD-10-CM

## 2019-07-09 DIAGNOSIS — F419 Anxiety disorder, unspecified: Secondary | ICD-10-CM

## 2019-07-09 DIAGNOSIS — N3281 Overactive bladder: Secondary | ICD-10-CM

## 2020-07-05 IMAGING — CR LOW_EXM
3 series · 3 of 3 positions shown · non-contrast
Comparison: none

[knee ap]
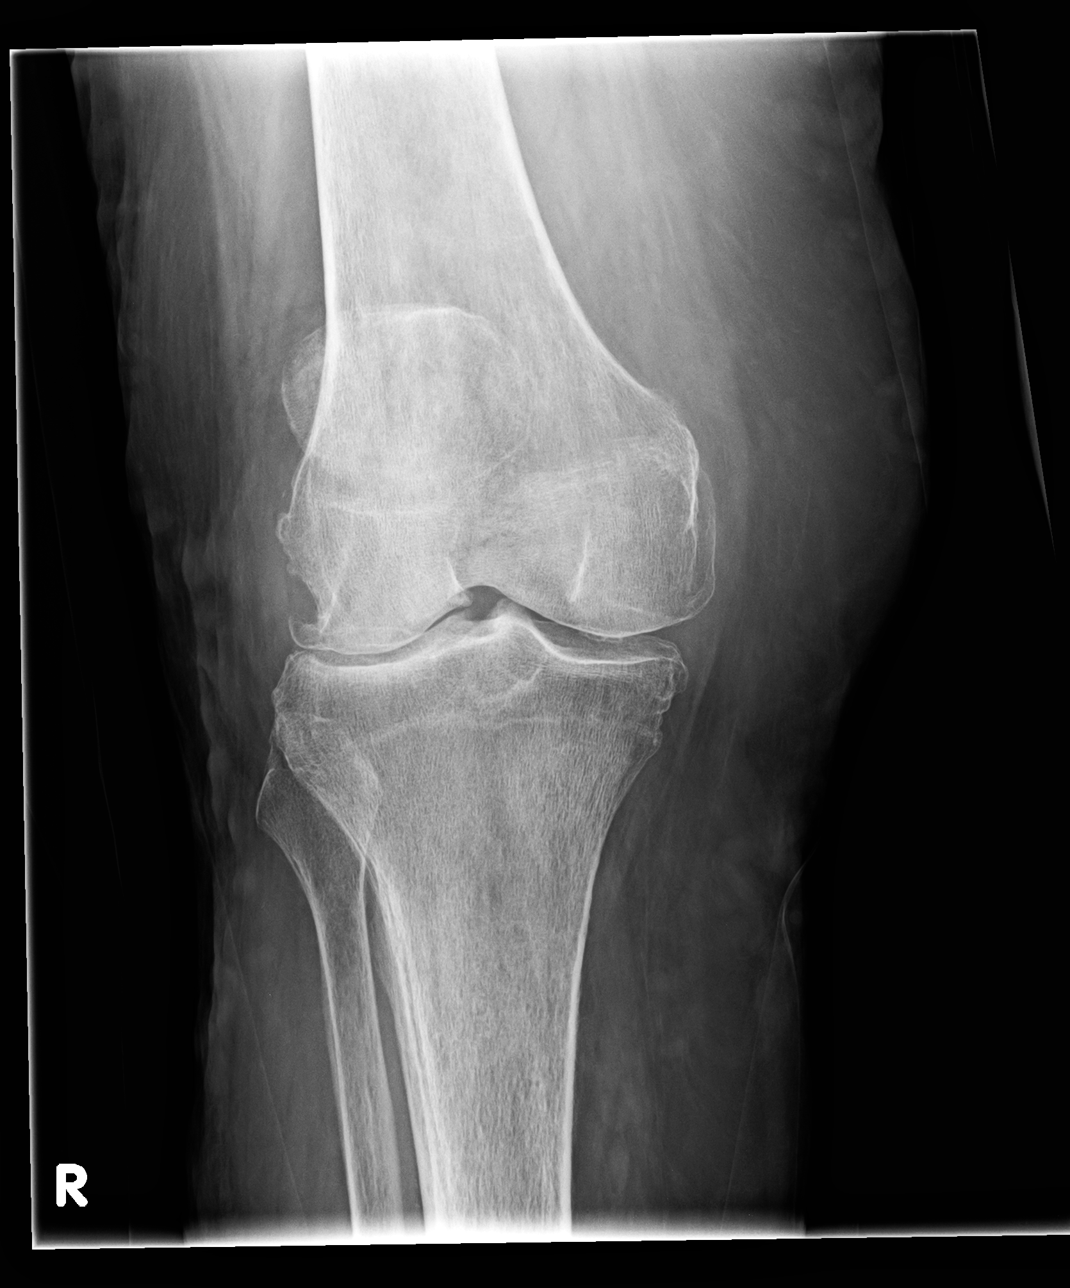

[knee lat]
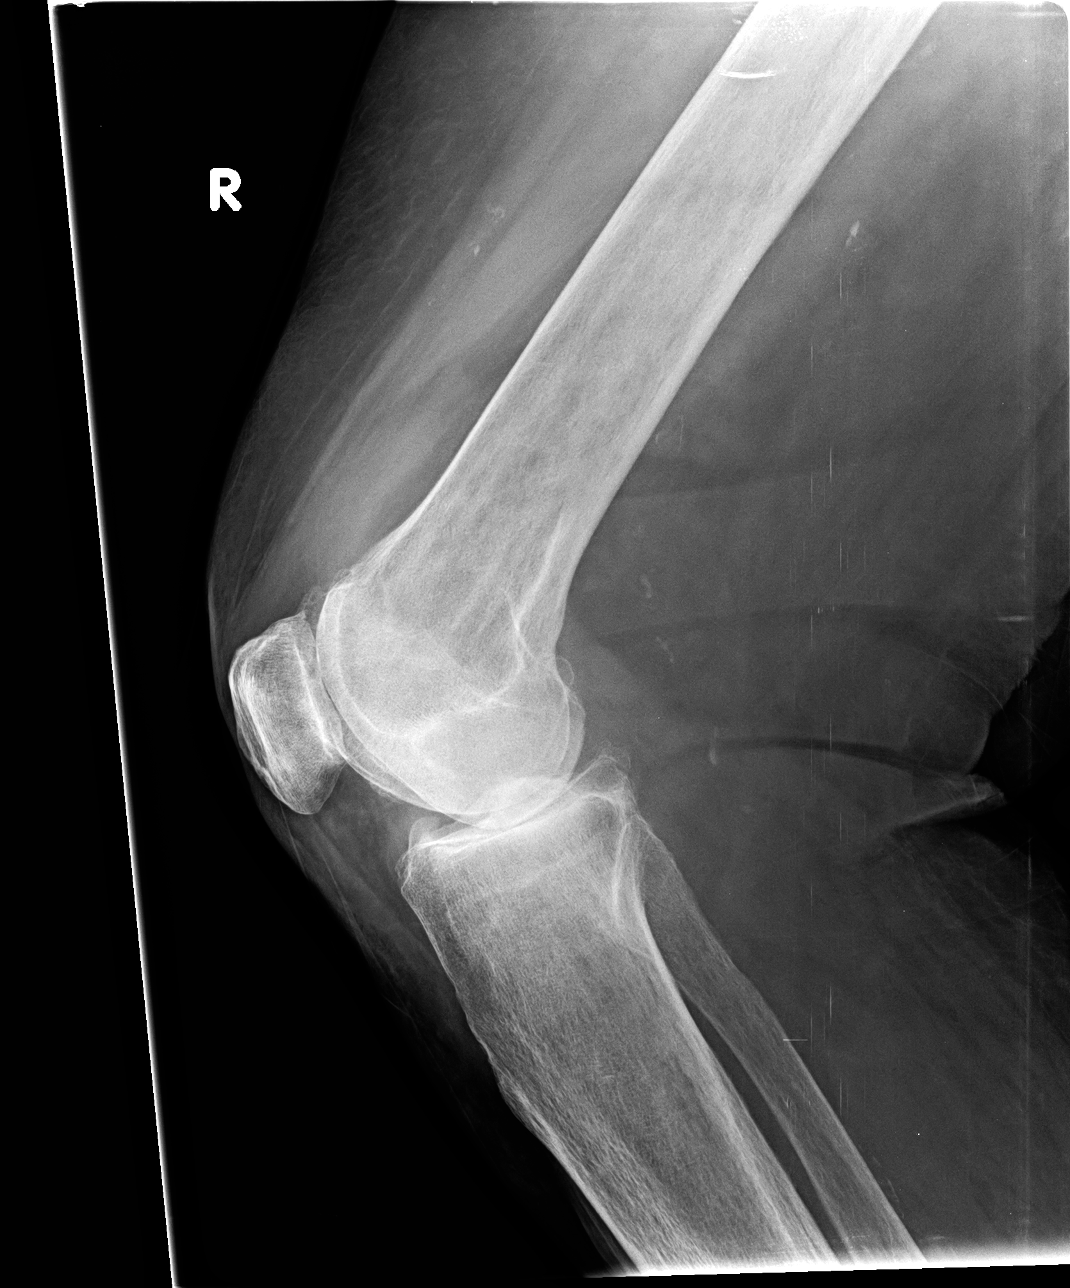

[knee sunrise]
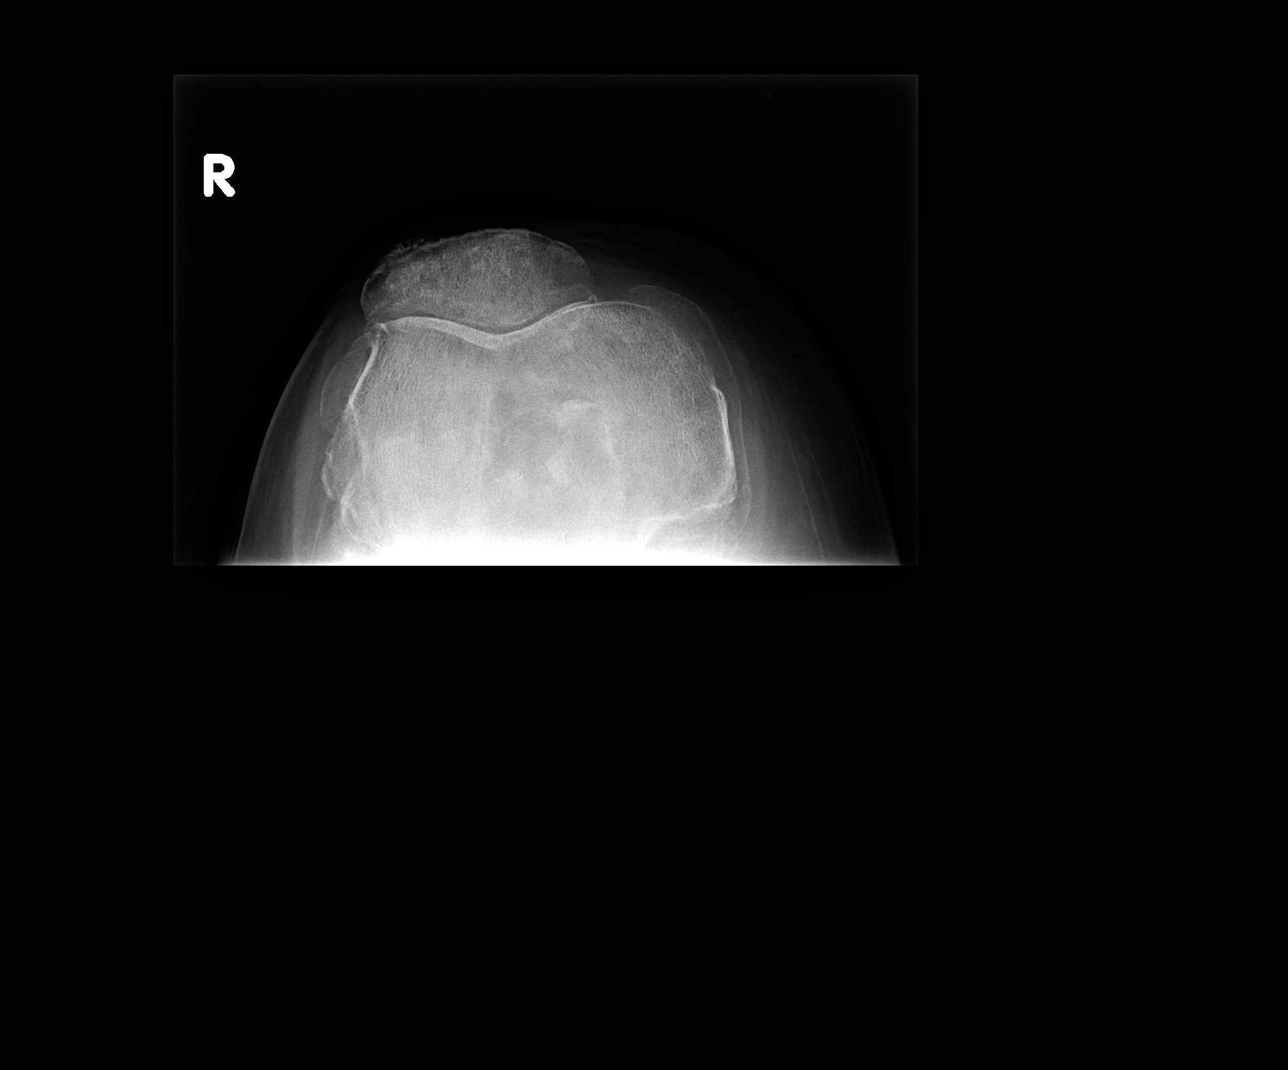

[3 of 3 positions shown; findings below may reference images not displayed]

EXAM

3 VIEW RIGHT KNEE

INDICATION

Right knee pain

COMPARISONS

None available

FINDINGS

There is no acute fracture or malalignment. Small joint effusion is identified. Tricompartmental
osteoarthritis including tricompartmental osteophytosis and joint space loss, joint space loss
greatest in the lateral compartment including joint surface sclerosis. Mild vascular calcifications.

IMPRESSION

No acute bony abnormality. Moderate to severe tricompartmental osteoarthritis, greatest lateral
compartment.

Tech Notes:

PAIN, WEAKNESS

## 2020-08-22 ENCOUNTER — Encounter: Admit: 2020-08-22 | Discharge: 2020-08-22 | Payer: MEDICARE

## 2020-08-22 DIAGNOSIS — I5032 Chronic diastolic (congestive) heart failure: Secondary | ICD-10-CM

## 2020-08-22 DIAGNOSIS — N3281 Overactive bladder: Secondary | ICD-10-CM

## 2020-08-22 DIAGNOSIS — I493 Ventricular premature depolarization: Secondary | ICD-10-CM

## 2020-08-22 DIAGNOSIS — E782 Mixed hyperlipidemia: Secondary | ICD-10-CM

## 2020-08-22 DIAGNOSIS — C4490 Unspecified malignant neoplasm of skin, unspecified: Secondary | ICD-10-CM

## 2020-08-22 DIAGNOSIS — I517 Cardiomegaly: Secondary | ICD-10-CM

## 2020-08-22 DIAGNOSIS — F419 Anxiety disorder, unspecified: Secondary | ICD-10-CM

## 2020-08-22 DIAGNOSIS — R0789 Other chest pain: Secondary | ICD-10-CM

## 2020-08-22 DIAGNOSIS — K219 Gastro-esophageal reflux disease without esophagitis: Secondary | ICD-10-CM

## 2020-08-22 DIAGNOSIS — G4700 Insomnia, unspecified: Secondary | ICD-10-CM

## 2020-08-22 DIAGNOSIS — I10 Essential (primary) hypertension: Secondary | ICD-10-CM

## 2020-08-22 DIAGNOSIS — E7800 Pure hypercholesterolemia, unspecified: Secondary | ICD-10-CM

## 2020-08-22 DIAGNOSIS — I453 Trifascicular block: Secondary | ICD-10-CM

## 2020-08-22 DIAGNOSIS — K5790 Diverticulosis of intestine, part unspecified, without perforation or abscess without bleeding: Secondary | ICD-10-CM

## 2020-08-22 LAB — LIPID PROFILE
Lab: 137 — ABNORMAL HIGH (ref <100)
Lab: 160 — ABNORMAL HIGH (ref <150)
Lab: 225 — ABNORMAL HIGH (ref <200)
Lab: 32
Lab: 4
Lab: 56

## 2020-08-22 LAB — COMPREHENSIVE METABOLIC PANEL
Lab: 0.3
Lab: 139
Lab: 15 — ABNORMAL HIGH (ref 0–14)
Lab: 18
Lab: 22
Lab: 4 g/dL — ABNORMAL LOW (ref 13.5–16.5)
Lab: 5.3 — ABNORMAL HIGH (ref 3.5–5.1)
Lab: 6.8
Lab: 61
Lab: 65
Lab: 9.4

## 2020-08-22 LAB — IRON + BINDING CAPACITY + %SAT+ FERRITIN
Lab: 155 — ABNORMAL HIGH (ref 70–105)
Lab: 241 — ABNORMAL LOW (ref 250–450)
Lab: 37 — ABNORMAL HIGH (ref 9.8–20.1)
Lab: 88

## 2020-08-22 LAB — CBC
Lab: 13
Lab: 14 — ABNORMAL HIGH (ref 11.5–14.5)
Lab: 269
Lab: 31
Lab: 31 — ABNORMAL HIGH (ref 27.0–31.0)
Lab: 4.3
Lab: 43
Lab: 9.1
Lab: 98

## 2020-08-22 LAB — BNP (B-TYPE NATRIURETIC PEPTI): Lab: 199 — ABNORMAL HIGH (ref 0–100)

## 2020-08-22 LAB — MAGNESIUM: Lab: 2.4

## 2020-10-17 ENCOUNTER — Encounter: Admit: 2020-10-17 | Discharge: 2020-10-17 | Payer: MEDICARE

## 2020-10-17 DIAGNOSIS — E7800 Pure hypercholesterolemia, unspecified: Secondary | ICD-10-CM

## 2020-10-17 DIAGNOSIS — C4490 Unspecified malignant neoplasm of skin, unspecified: Secondary | ICD-10-CM

## 2020-10-17 DIAGNOSIS — G4700 Insomnia, unspecified: Secondary | ICD-10-CM

## 2020-10-17 DIAGNOSIS — K219 Gastro-esophageal reflux disease without esophagitis: Secondary | ICD-10-CM

## 2020-10-17 DIAGNOSIS — I10 Essential (primary) hypertension: Secondary | ICD-10-CM

## 2020-10-17 DIAGNOSIS — F419 Anxiety disorder, unspecified: Secondary | ICD-10-CM

## 2020-10-17 DIAGNOSIS — K5790 Diverticulosis of intestine, part unspecified, without perforation or abscess without bleeding: Secondary | ICD-10-CM

## 2020-10-17 DIAGNOSIS — N3281 Overactive bladder: Secondary | ICD-10-CM

## 2021-02-08 IMAGING — CR CHEST
1 series · 1 of 1 positions shown · non-contrast
Comparison: none

[chest port x-wise]
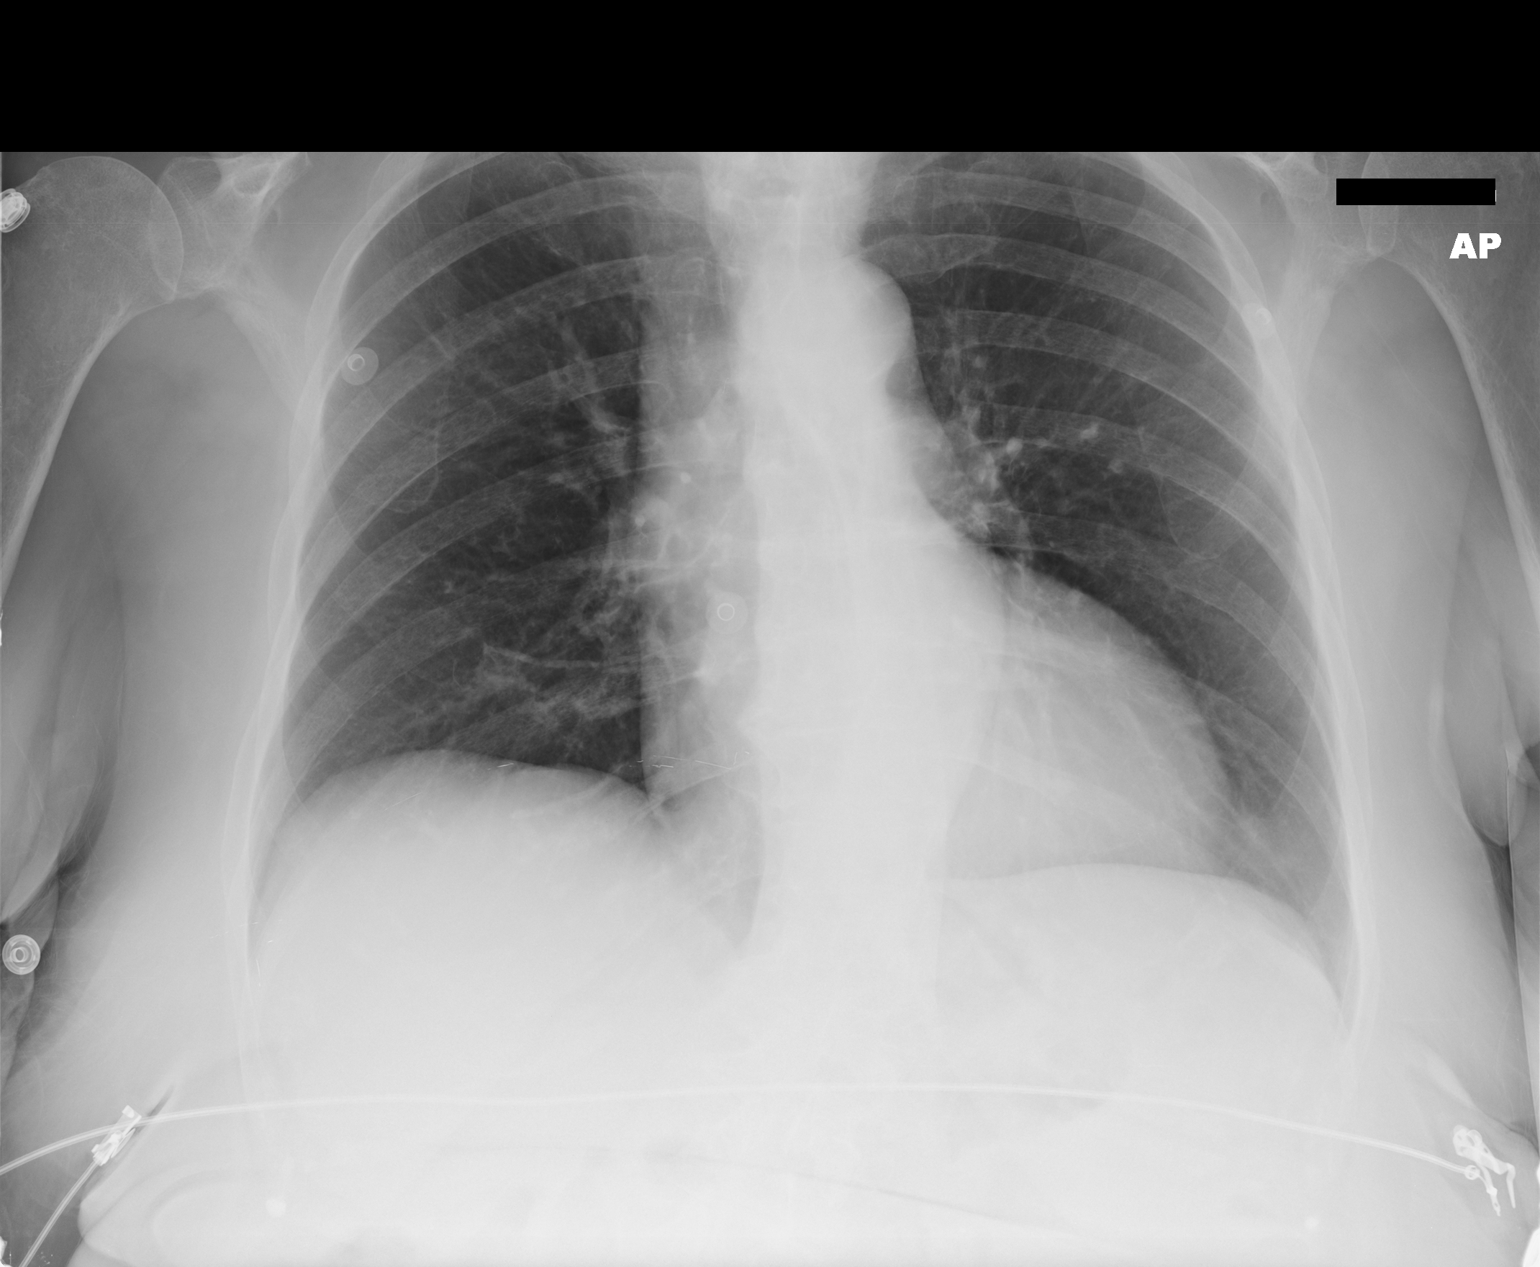

[1 of 1 positions shown; findings below may reference images not displayed]

EXAM

RADIOLOGICAL EXAMINATION, CHEST; SINGLE VIEW, FRONTAL CPT 88989

INDICATION

HTN AND CHEST PAIN THIS MORNING. HX. HTN CONTROLLED WITH MEDS.  AB/RG

TECHNIQUE

1 view of the chest was acquired.

COMPARISONS

Previous examination dated 12/28/2016.

FINDINGS

The cardiac silhouette is enlarged. Lungs are free of focal consolidations. Chronic interstitial
changes are noted. Pulmonary vasculature is normal in caliber. The costophrenic angles are sharp.
The bony structures are adequately mineralized.

IMPRESSION

Cardiomegaly. Chronic interstitial changes. No pneumonia.

Tech Notes:

HTN AND CHEST PAIN THIS MORNING. HX. HTN CONTROLLED WITH MEDS.  AB/RG

## 2021-09-24 ENCOUNTER — Encounter: Admit: 2021-09-24 | Discharge: 2021-09-24 | Payer: MEDICARE

## 2021-10-07 ENCOUNTER — Encounter: Admit: 2021-10-07 | Discharge: 2021-10-07 | Payer: MEDICARE

## 2021-10-07 DIAGNOSIS — I493 Ventricular premature depolarization: Secondary | ICD-10-CM

## 2021-10-07 DIAGNOSIS — R0789 Other chest pain: Secondary | ICD-10-CM

## 2021-10-07 DIAGNOSIS — I10 Essential (primary) hypertension: Secondary | ICD-10-CM

## 2021-10-07 DIAGNOSIS — C4490 Unspecified malignant neoplasm of skin, unspecified: Secondary | ICD-10-CM

## 2021-10-07 DIAGNOSIS — E782 Mixed hyperlipidemia: Secondary | ICD-10-CM

## 2021-10-07 DIAGNOSIS — I5032 Chronic diastolic (congestive) heart failure: Secondary | ICD-10-CM

## 2021-10-07 DIAGNOSIS — F419 Anxiety disorder, unspecified: Secondary | ICD-10-CM

## 2021-10-07 DIAGNOSIS — K5790 Diverticulosis of intestine, part unspecified, without perforation or abscess without bleeding: Secondary | ICD-10-CM

## 2021-10-07 DIAGNOSIS — G4700 Insomnia, unspecified: Secondary | ICD-10-CM

## 2021-10-07 DIAGNOSIS — N3281 Overactive bladder: Secondary | ICD-10-CM

## 2021-10-07 DIAGNOSIS — I453 Trifascicular block: Secondary | ICD-10-CM

## 2021-10-07 DIAGNOSIS — R0989 Other specified symptoms and signs involving the circulatory and respiratory systems: Secondary | ICD-10-CM

## 2021-10-07 DIAGNOSIS — K219 Gastro-esophageal reflux disease without esophagitis: Secondary | ICD-10-CM

## 2021-10-07 DIAGNOSIS — I517 Cardiomegaly: Secondary | ICD-10-CM

## 2021-10-07 DIAGNOSIS — E7800 Pure hypercholesterolemia, unspecified: Secondary | ICD-10-CM

## 2021-10-07 NOTE — Patient Instructions
Follow up in 6 mo  Follow up as directed.  Call sooner if issues.  Call the [place] line at [phone].  Leave a detailed message for the nurse in [place] with how we can assist you and we will call you back.

## 2022-03-24 ENCOUNTER — Encounter: Admit: 2022-03-24 | Discharge: 2022-03-24 | Payer: MEDICARE

## 2022-03-24 DIAGNOSIS — I5032 Chronic diastolic (congestive) heart failure: Secondary | ICD-10-CM

## 2022-03-24 DIAGNOSIS — K5790 Diverticulosis of intestine, part unspecified, without perforation or abscess without bleeding: Secondary | ICD-10-CM

## 2022-03-24 DIAGNOSIS — N3281 Overactive bladder: Secondary | ICD-10-CM

## 2022-03-24 DIAGNOSIS — C4490 Unspecified malignant neoplasm of skin, unspecified: Secondary | ICD-10-CM

## 2022-03-24 DIAGNOSIS — I10 Essential (primary) hypertension: Secondary | ICD-10-CM

## 2022-03-24 DIAGNOSIS — K219 Gastro-esophageal reflux disease without esophagitis: Secondary | ICD-10-CM

## 2022-03-24 DIAGNOSIS — G4700 Insomnia, unspecified: Secondary | ICD-10-CM

## 2022-03-24 DIAGNOSIS — I493 Ventricular premature depolarization: Secondary | ICD-10-CM

## 2022-03-24 DIAGNOSIS — R0789 Other chest pain: Secondary | ICD-10-CM

## 2022-03-24 DIAGNOSIS — I517 Cardiomegaly: Secondary | ICD-10-CM

## 2022-03-24 DIAGNOSIS — F419 Anxiety disorder, unspecified: Secondary | ICD-10-CM

## 2022-03-24 DIAGNOSIS — R0989 Other specified symptoms and signs involving the circulatory and respiratory systems: Secondary | ICD-10-CM

## 2022-03-24 DIAGNOSIS — E782 Mixed hyperlipidemia: Secondary | ICD-10-CM

## 2022-03-24 DIAGNOSIS — E7800 Pure hypercholesterolemia, unspecified: Secondary | ICD-10-CM

## 2022-03-24 DIAGNOSIS — I453 Trifascicular block: Secondary | ICD-10-CM

## 2022-03-24 MED ORDER — AMLODIPINE 2.5 MG PO TAB
2.5 mg | ORAL_TABLET | Freq: Every day | ORAL | 3 refills | Status: AC
Start: 2022-03-24 — End: ?

## 2022-04-22 ENCOUNTER — Encounter: Admit: 2022-04-22 | Discharge: 2022-04-22 | Payer: MEDICARE

## 2022-04-22 NOTE — Telephone Encounter
-----   Message from Asencion Noble sent at 03/24/2022  8:26 AM CST -----  Call to see how bp running

## 2022-07-15 IMAGING — CT HEADWO
2 of 4 series · 11 of 47 positions shown, 13 images · IV contrast (agent unspecified)
Comparison: None available.

EXAMINATION: CT HEAD WITHOUT CONTRAST, CT FACIAL BONES WITHOUT CONTRAST, CT CERVICAL SPINE WITHOUT
CONTRAST
INDICATION: Trauma/fall.
TECHNIQUE: Helically acquired CT images of the head, facial bones, and cervical spine were obtained
and subsequently reconstructed in the axial, coronal, sagittal planes  utilizing bone and soft
tissue algorithms.

All CT scans at this facility use dose modulation, iterative reconstruction, and/or weight based
dosing when appropriate to reduce radiation dose to as low as reasonably achievable.
IV CONTRAST: None.

[Series 503: brain cor 5.00 hr40 s3 · coronal · 0.33mm/px · 3 of 38 slices shown]
[im 13/38  brain]
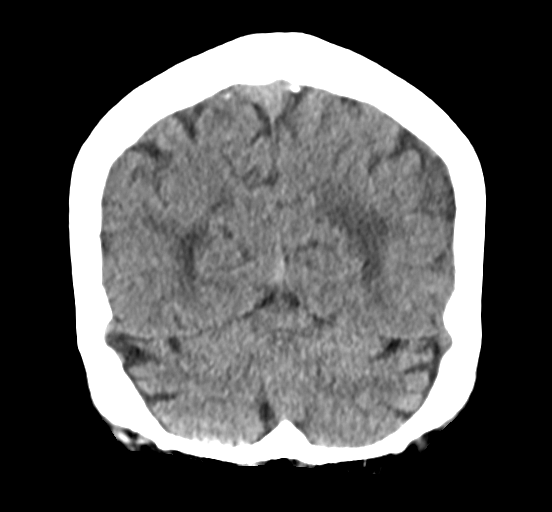
[im 17/38  brain]
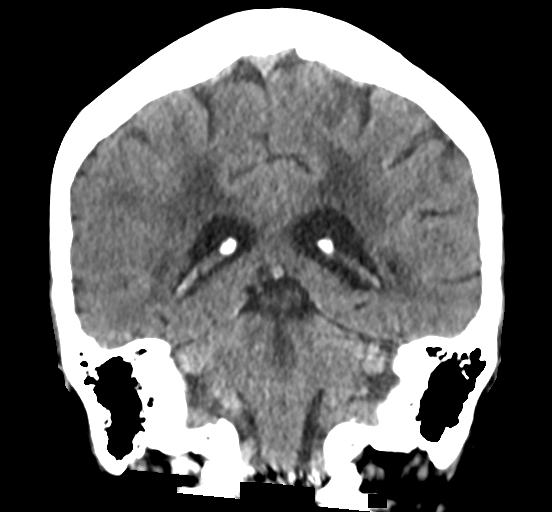
[im 21/38  brain]
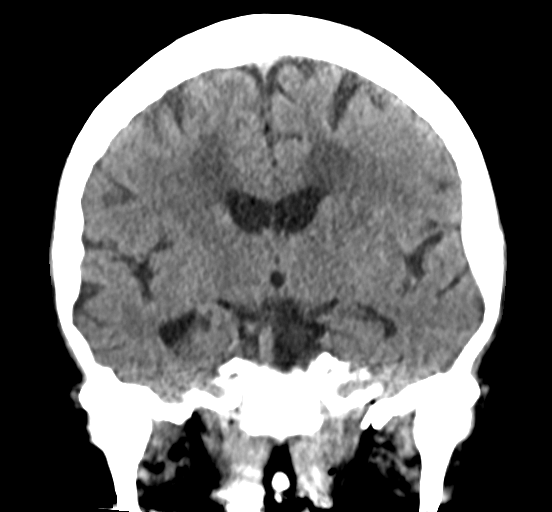

[Series 505: brain ax 2.00 hr60 s3 · axial · 0.35mm/px · z∈[-599,-468]mm · 8 of 73 slices shown, 10 images]
[im 7/73  brain]
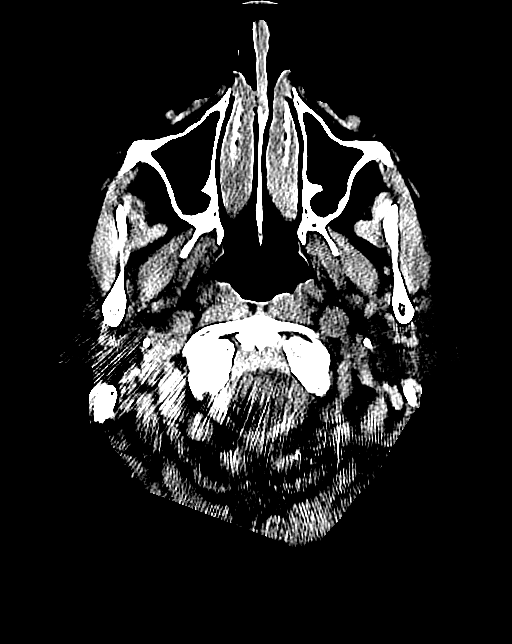
[im 7/73  bone]
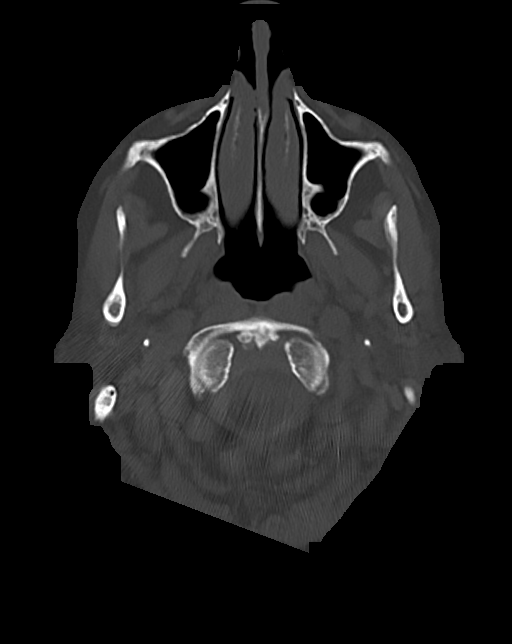
[im 14/73  brain]
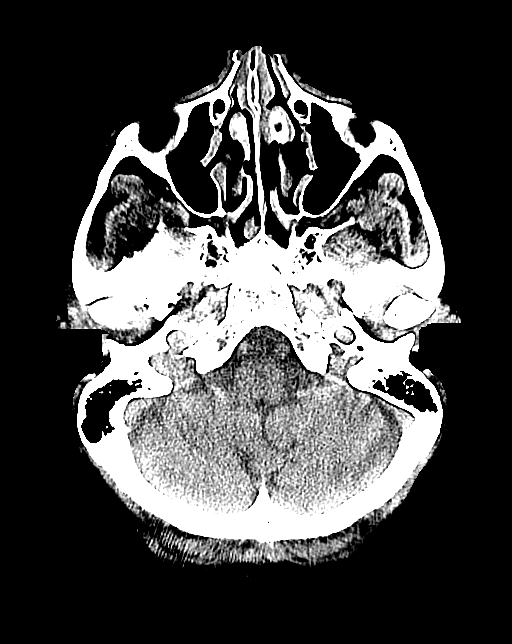
[im 25/73  brain]
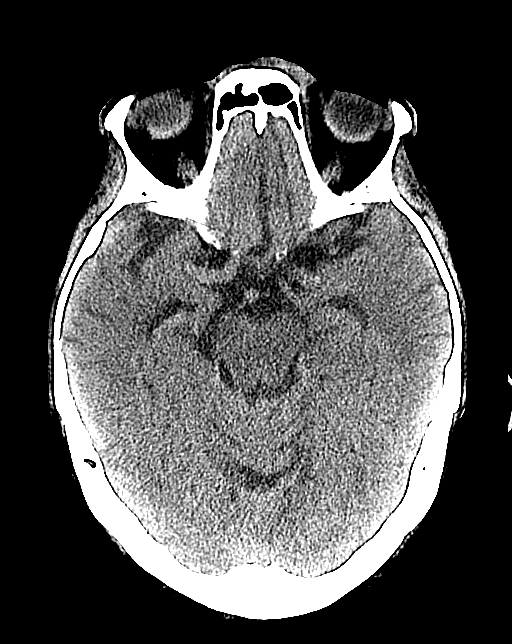
[im 31/73  brain]
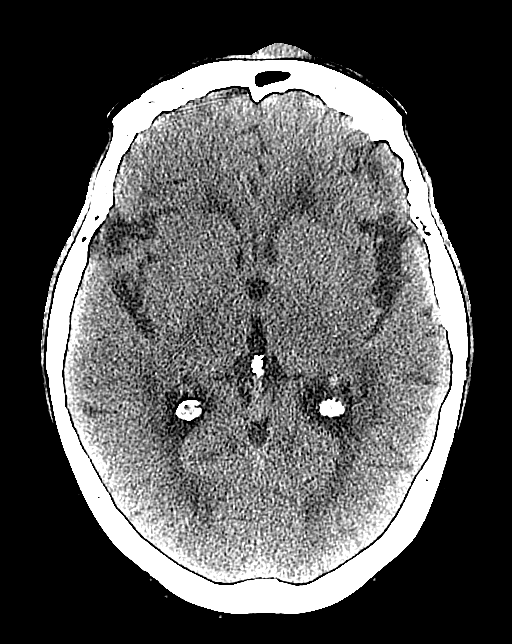
[im 42/73  brain]
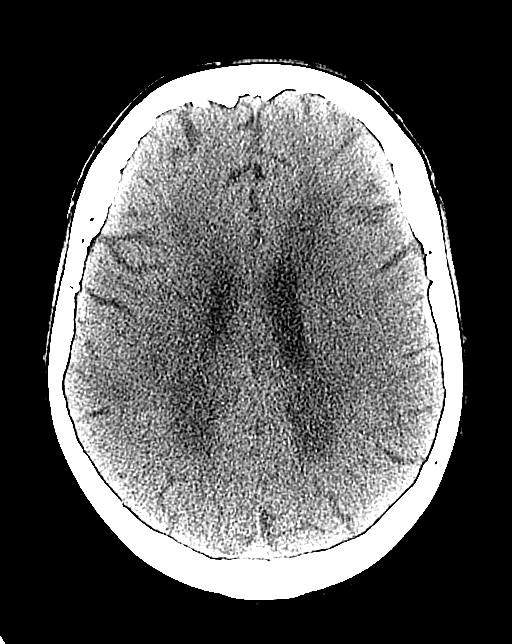
[im 42/73  bone]
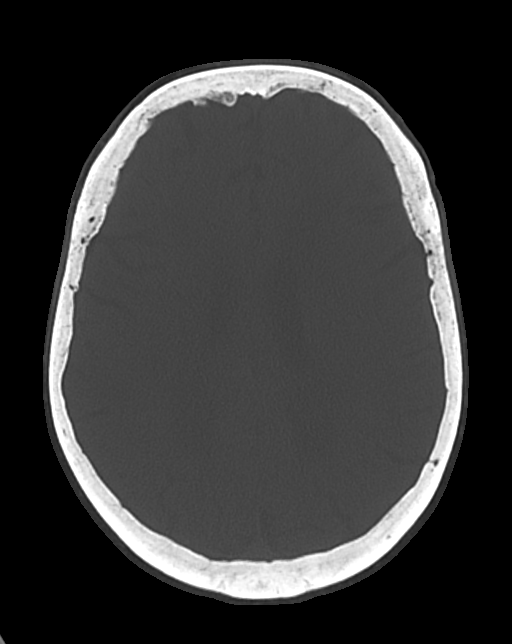
[im 49/73  brain]
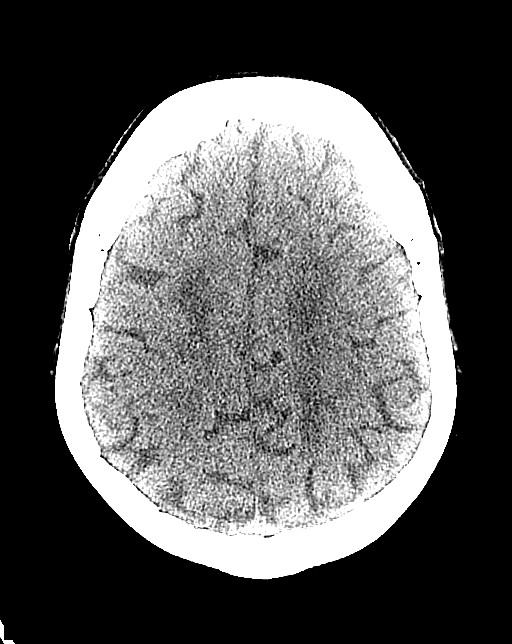
[im 59/73  brain]
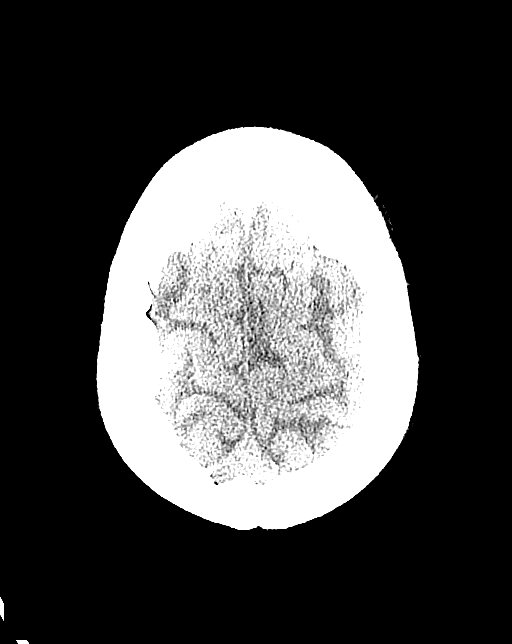
[im 66/73  brain]
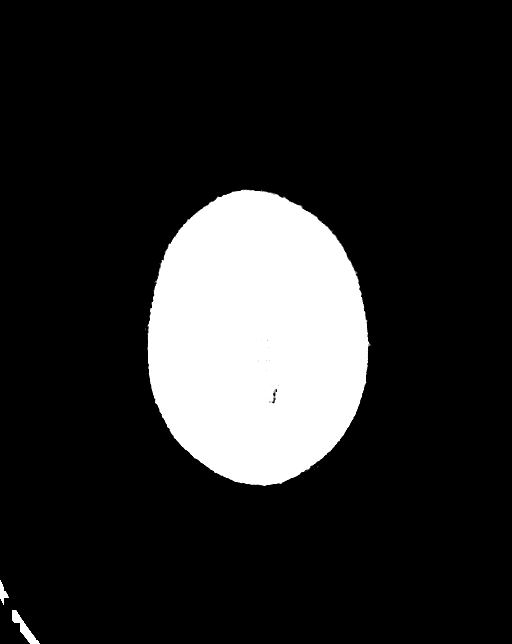

[11 of 47 positions shown; findings below may reference images not displayed]

Number of previous computed tomography exams in the last 12 months is 0.

Number of previous nuclear medicine myocardial perfusion studies in the last 12 months is 0.
FINDINGS: HEAD:

Brain:  Mild diffuse cerebral and cerebellar volume loss with chronic microvascular hepatic
ischemic changes of periventricular white matter. There is no evidence of acute infarction, and
gray-white differentiation is maintained throughout. There is no intracranial hemorrhage. There is
no evidence of mass effect.

Ventricles and Cisterns: There is no midline shift. The ventricles are symmetric and normally
configured. There is no indication of hydrocephalus. The basal cisterns are patent.

Vasculature:  Atherosclerotic calcification, bilateral cavernous ICAs.

Extra-Axial: Extra-axial spaces are normal.

Orbits: Prior bilateral lens surgery. Orbital contents are otherwise normal.

Paranasal Sinuses and Mastoid Air Cells: Clear.

Bones: Large subgaleal hematoma, left frontal region. There is no acute calvarial or skull base
fracture. No suspicious osteolytic or osteolytic blastic lesion.

MAXILLOFACIAL BONES:

Facial Bones: Mildly displaced fractures, bilateral nasal bones. There is no acute fracture of the
mandible, maxillae, zygomatic bones, perpendicular plate/bony nasal septum, sphenoid, medial/lateral
pterygoid plates, or frontal bone.

Orbits: Prior bilateral lens surgery. Remaining intraorbital contents, including the globes and
extraocular muscles, are unremarkable.

Soft Tissues: Large subgaleal hematoma, left frontal region. Significant soft tissue swelling about
the nose.

Paranasal Sinuses And Mastoid Air Cells: Clear.

Skull Base: There is no acute fracture. There is no aggressive appearing bone lesion.

Intra-cranial Contents:  Intracranial volume loss and atherosclerosis.

CERVICAL SPINE:

Alignment: There is no atlanto-occipital or atlantoaxial malalignment. There is no malalignment of
the remaining spine.

Bones: There is no acute fracture, aggressive-appearing osteolytic/osteoblastic lesion, or
periostitis. The vertebral body heights are maintained.

Discs, Spinal Canal, and Neural Foramina: Diffuse disc degeneration with mild disc height loss
throughout the cervical spine. Level by level spinal canal and neural foraminal evaluation as
follows:

- C2-3: No definitive disc-osteophyte complex bulge. Bilateral facet arthrosis. No signifitant
spinal canal stenosis. Mild right neural foraminal narrowing.

- C3-4: No definitive disc-osteophyte complex bulge. Bilateral uncovertebral and facet arthrosis.
No signifitant spinal canal stenosis. No significant neural foraminal narrowing.

- C4-5: No definitive disc-osteophyte complex bulge. Bilateral uncovertebral arthrosis. No
signifitant spinal canal stenosis. Mild bilateral neuroforaminal narrowing.

- C5-6: No definitive disc-osteophyte complex bulge. Bilateral uncovertebral arthrosis. No
signifitant spinal canal stenosis. Mild left and moderate right neural foraminal narrowing.

- C6-7: No definitive disc-osteophyte complex bulge. Bilateral uncovertebral arthrosis. No
signifitant spinal canal stenosis. Mild bilateral neuroforaminal narrowing.

- C7-T1: No definitive disc-osteophyte complex bulge. Bilateral facet arthrosis. No signifitant
spinal canal stenosis. No significant neural foraminal narrowing.

Spinal Cord: Unremarkable, though cord evaluation by CT is limited.

Intracranial Contents: Partial evaluation of the intracranial contents is unremarkable.

Soft Tissues and Upper Thorax: The paravertebral soft tissues are within normal limits. The thyroid
gland is normal. There is no appreciable lymphadenopathy or mass. The imaged lung apices are grossly
unremarkable.
IMPRESSION: 1. No acute intracranial abnormalities.

2. Large left frontal subgaleal hematoma. No calvarial fracture.

3. Soft tissue swelling about the nose with mildly displaced bilateral nasal bone fractures.

4. No acute osseous abnormalities in the cervical spine.

5. Remaining chronic/nonemergent findings as detailed above.

Tech Notes:

PT WAS STEPPING UP ON A CURB AND FELL, PAIN TO BILATERAL KNEES, LARGE "LUMP" TO FOREHEAD AND
BLEEDING AND SWELLING TO NOSE.  NO BLOOD THINNERS.  HX KNEE REPLACEMENT.

## 2022-07-15 IMAGING — CR [ID]
2 series · 2 of 2 positions shown · non-contrast
Comparison: none

[knee ap]
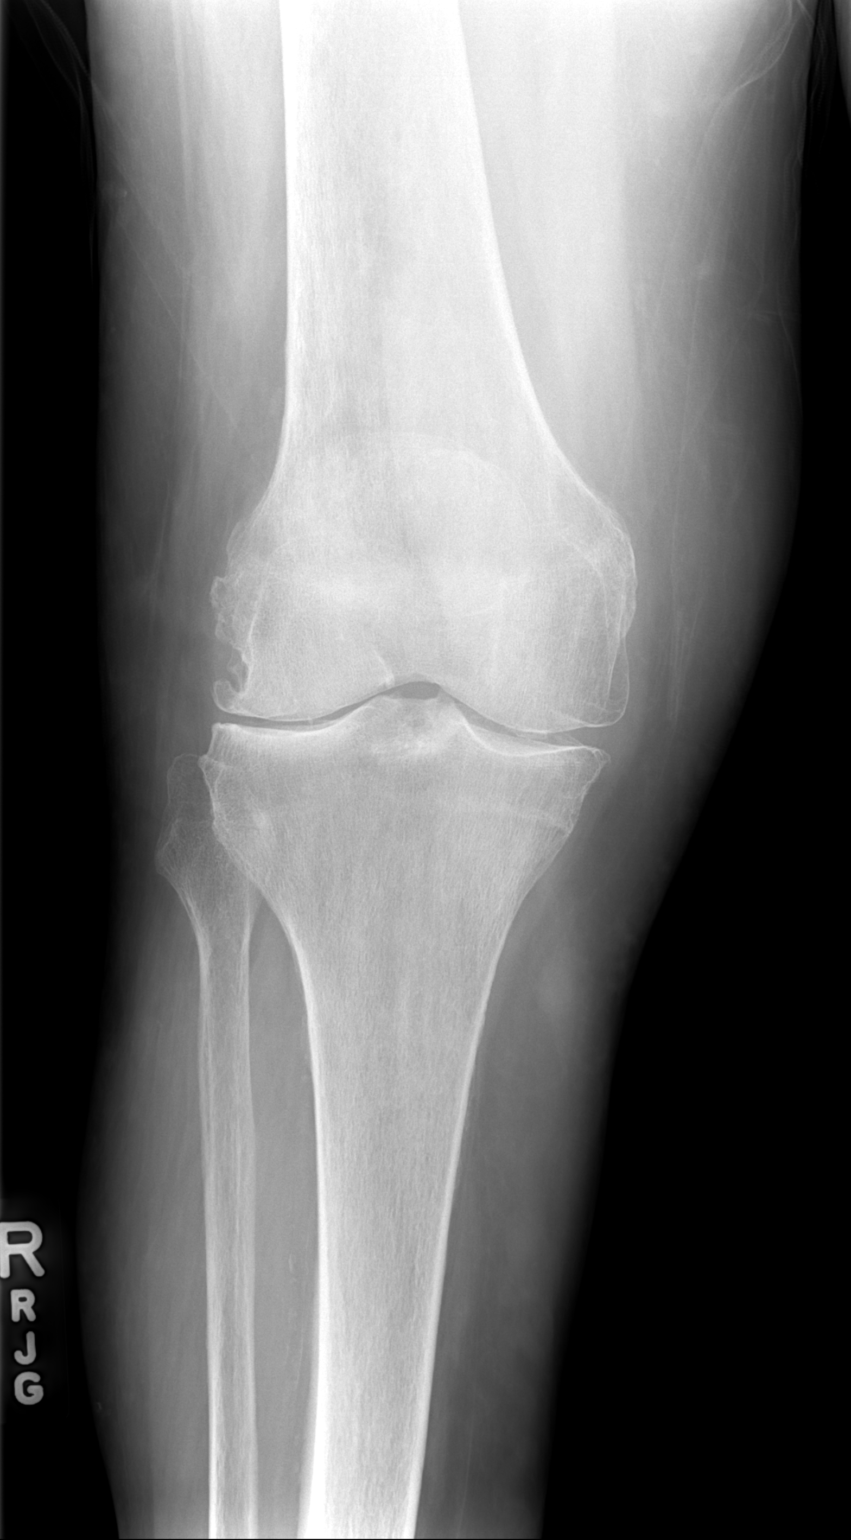

[knee lat]
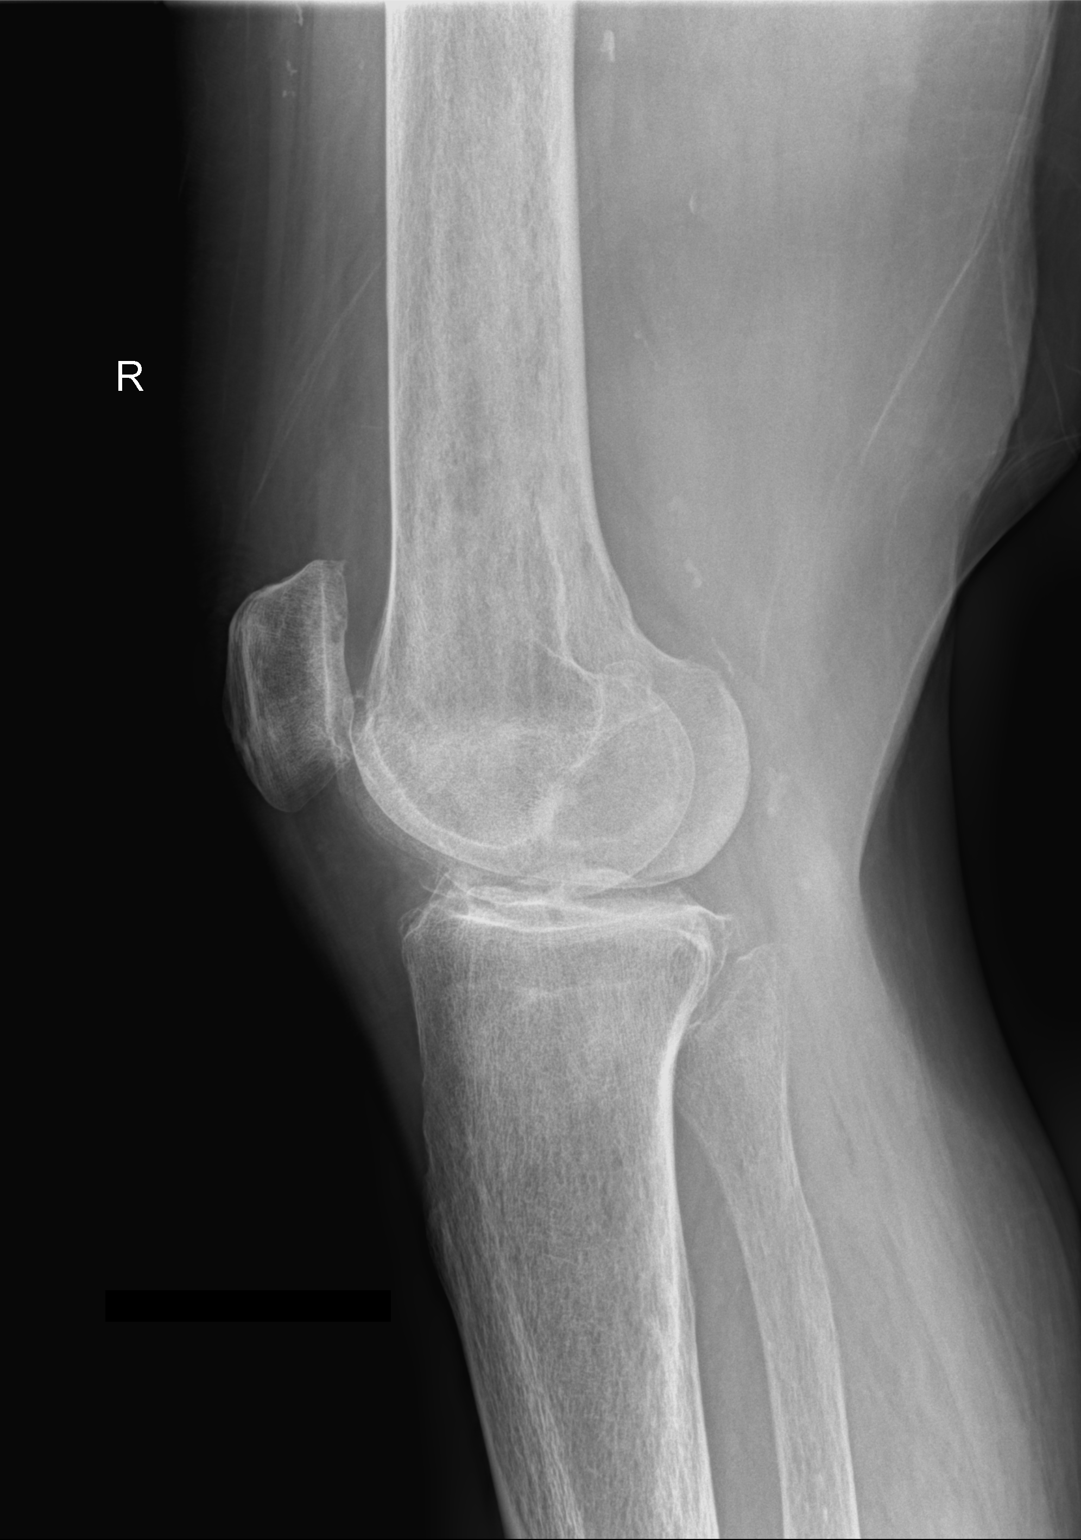

[2 of 2 positions shown; findings below may reference images not displayed]

DIAGNOSTIC STUDIES

EXAM

XR chest two views, XR right knee three views, XR left knee three views

INDICATION

Fall to knees
PT WAS STEPPING UP ON A CURB AND FELL, PAIN TO BILATERAL KNEES, LARGE "LUMP" TO FOREHEAD AND
BLEEDING AND SWELLING TO NOSE.  NO BLOOD THINNERS.  HX KNEE REPLACEMENT.

TECHNIQUE

AP and lateral chest radiographs and 3 images of each knee were obtained.

COMPARISONS

Chest x-ray, December 28, 2016.

FINDINGS

Chest: Clear lungs. No pneumothorax or pleural effusion. Stable cardiomediastinal silhouette.
Aortic arch calcification indicates atherosclerosis. Unremarkable upper abdomen. Bones are
demineralized. Mild disc degeneration throughout the spine. No acute fracture or malalignment. Mild
bilateral AC joint degenerative change. Prominent subacromial enthesophytes, which increases risk
for rotator cuff tendon impingement.

Left knee: Prior left total knee arthroplasty. Hardware is intact and appropriately aligned. No
evidence of malfunction or loosening. No acute fracture, malalignment, aggressive appearing bone
lesion, or periosteal reaction. No significant joint effusion. Regional soft tissues are
unremarkable.

Right knee: No acute fracture, malalignment, aggressive appearing bone lesion, or periosteal
reaction. Moderate tricompartmental osteoarthrosis. Regional soft tissues are unremarkable.

IMPRESSION

Chest: No acute cardiopulmonary process.

Left knee: Prior total knee arthroplasty. No evidence of hardware malfunction or loosening. No
acute osseous abnormalities.

Right knee: No acute osseous abnormalities. Moderate tricompartmental osteoarthrosis.

Tech Notes:

PT WAS STEPPING UP ON A CURB AND FELL, PAIN TO BILATERAL KNEES, LARGE "LUMP" TO FOREHEAD AND
BLEEDING AND SWELLING TO NOSE.  NO BLOOD THINNERS.  HX KNEE REPLACEMENT.

## 2023-06-03 ENCOUNTER — Encounter: Admit: 2023-06-03 | Discharge: 2023-06-03 | Payer: MEDICARE

## 2023-06-08 ENCOUNTER — Ambulatory Visit: Admit: 2023-06-08 | Discharge: 2023-06-09 | Payer: MEDICARE

## 2023-06-08 ENCOUNTER — Encounter: Admit: 2023-06-08 | Discharge: 2023-06-08 | Payer: MEDICARE

## 2023-06-14 ENCOUNTER — Encounter: Admit: 2023-06-14 | Discharge: 2023-06-14 | Payer: MEDICARE

## 2023-06-14 NOTE — Progress Notes
The following are results of an MRI of head/brain performed at outside facility. Will route to Dr. Sandria Manly for review.

## 2023-11-23 ENCOUNTER — Encounter
Admit: 2023-11-23 | Discharge: 2023-11-23 | Payer: MEDICARE | Primary: Student in an Organized Health Care Education/Training Program

## 2023-11-26 ENCOUNTER — Encounter
Admit: 2023-11-26 | Discharge: 2023-11-26 | Payer: MEDICARE | Primary: Student in an Organized Health Care Education/Training Program

## 2023-11-26 NOTE — Progress Notes
 [place] needs daily blood pressure and heart rate checks at 8:00.  Please fax results after 8/1 to [facility] at [phone].

## 2023-12-13 ENCOUNTER — Ambulatory Visit
Admit: 2023-12-13 | Discharge: 2023-12-13 | Payer: MEDICARE | Primary: Student in an Organized Health Care Education/Training Program

## 2023-12-13 ENCOUNTER — Encounter
Admit: 2023-12-13 | Discharge: 2023-12-13 | Payer: MEDICARE | Primary: Student in an Organized Health Care Education/Training Program

## 2023-12-15 ENCOUNTER — Encounter
Admit: 2023-12-15 | Discharge: 2023-12-15 | Payer: MEDICARE | Primary: Student in an Organized Health Care Education/Training Program

## 2023-12-15 ENCOUNTER — Ambulatory Visit
Admit: 2023-12-15 | Discharge: 2023-12-16 | Payer: MEDICARE | Primary: Student in an Organized Health Care Education/Training Program

## 2023-12-15 DIAGNOSIS — I4891 Unspecified atrial fibrillation: Secondary | ICD-10-CM

## 2023-12-15 DIAGNOSIS — I493 Ventricular premature depolarization: Principal | ICD-10-CM

## 2023-12-15 DIAGNOSIS — R001 Bradycardia, unspecified: Secondary | ICD-10-CM

## 2023-12-15 DIAGNOSIS — R0989 Other specified symptoms and signs involving the circulatory and respiratory systems: Principal | ICD-10-CM

## 2023-12-15 DIAGNOSIS — I5032 Chronic diastolic (congestive) heart failure: Secondary | ICD-10-CM

## 2023-12-15 NOTE — Progress Notes
 Date of Service: 12/15/2023    Misty Jordan is a 89 y.o. female.       HPI     I had the pleasure of seeing your patient Misty Jordan in the [facility] at our [facility] office today for initial Electrophysiolgy Consultation regarding her trifascicular block and evaluation for a pacemaker.     She is typically followed and was referred by my partner Dr. Maurice, her primary cardiologist.  Misty Jordan is an exceptionally pleasant 89 y.o. female, who is accompanied by her equally pleasant daughter-in-law, Inocente.  Misty Jordan was born and raised in [place].  She has four children, one of which is an Investment banker, operational in [place].  She also has several grandchildren and just had her first great grandchild.  Her daughter-in-law, Inocente, who is with her today, is a retired Teacher, early years/pre.  Ms. Orsini has 5 children. She lives in a nursing home.     **All data in this note, including the past medical history, was newly collected today.    Her PMHx briefly includes:  Trifascicular Block (First-degree AV Block, RBBB, LAFB); Permanent Atrial Fibrillation; HFpEF (LVEF 58% by Echo 11/2023); Essential HTN; Dyslipidemia; Mild to Moderate MR; GERD     She has a CHADS2VASc score of 4:  Female; Age x2; HTN    To Review Detailed Updated PMHx see below the ASSESSMENT AND PLAN section of this note.      She STATES she gets fatigued easily. She states she used to have more energy.     She states she is not exercising much at the nursing home she stays at. She states she does not have the energy for more regular exercise.     She denies any chest discomfort, shortness of breath, palpitations, lightheadedness, near syncope or syncope, PND or orthopnea.    FHx, SHx and ROS documented and I have reviewed, with some pertinent features to include: No FHx of premature CAD. She is a Non-Smoker. Most pertinent ROS is included/discussed throughout the note, e.g. HPI and A/P.      ASSESSMENT AND PLAN:    -- Fatigue  -- In NSR-Trifascicular Block (First-degree AV Block, RBBB, LAFB)-->in AFIB Bifascicular Block  -- Permanent Atrial Fibrillation  -- HFpEF (LVEF 58% by Echo 11/2023)  -- Essential HTN  -- Dyslipidemia  -- Mild to Moderate MR  -- GERD     Misty Jordan, back in January 2025, was noted to be in atrial fibrillation during her office visit with Dr. Maurice.  Her average heart rate was 59 bpm.  She was started on Eliquis at that time.    Her ventricular rate at that time was 59 bpm.  She was not having significant symptoms.  She denied any lightheadedness near-syncope or syncope at that time.    She followed up with Dr. Maurice on 11/23/2023.  Of course she was still in persistent atrial fibrillation and her average heart rate was 50 bpm.    Her bifascicular block was still present of course.    Given her slow ventricular rates and her bifascicular block Dr. Maurice, appropriately, was concerned about worsening or symptomatic bradycardia arrhythmias or just her bradycardia arrhythmias causing the current fatigue that she complains of.  Therefore who referred her for consideration of permanent pacemaker implantation.    We had a lengthy discussion regarding Atrial Fibrillation, the pathophysiology, the mechanism, and therapeutic options. We discussed what I call the 3 R's:  The Rhythm being abnormal; the Rate being Rapid;  and the Risk of stroke.     Regarding Rhythm--it appears as though he is except that her A-fib is permanent.  That is reasonable.  While it is possible her A-fib is causing her symptoms and negative less likely.  It is more likely related to her slow rates.  See further discussion regarding this below.      Regarding Rate--as noted her ventricular rates are slow.  I recommended Micra VR leadless permanent pacemaker implantation with a lower rate limit set to 60 ppm.  Rate response would be turned on.    We discussed that it is possible that pacing and improving her rates may not improve her symptoms but is more likely that her symptoms are attributable to her slow rates.  Furthermore with her bifascicular block she is in an increased risk for higher grade AV block that could result in syncope.  That concept alone concerns her and her family enough to want to pursue pacemaker implantation.    We discussed the difference between a leadless pacemaker and a pacemaker that is placed transvenously with the leads.    We discussed the difference tween a dual-chamber pacemaker and a single-chamber pacemaker.  Since it appears her rhythm will be excepted this permanent a single-chamber pacemaker is appropriate.    We discussed the procedure of leadless device implantation and the risk of the procedure at length.    We discussed the procedure itself along with its associated risks, rationale, options, and benefits in detail with Misty Jordan and her daughter-in-law. We discussed the risks to include, but not be limited to: death, MI, stroke, cardiac perforation, bleeding, swelling, hematoma, infection, pneumothorax, DVT, or vascular injury.     She, the patient, expressed an understanding of the procedure and risks, and wishes to proceed.    We will have her hold Eliquis the morning of the procedure.    We will see how she does with moderate sedation but I think it is important to have anesthesia backup if nothing else given her age and her spine issues.  Therefore we will have her evaluated in the outpatient anesthesia clinic      Regarding Risk of Stroke-- she is on Eliquis 5 mg BID. Despite her age, her creatine is normal and her weight is above 60 kg.  Therefore she is on the appropriate dose of Eliquis.       See additional plan details below.      PLAN:    -- We will pursue leadless Micra AV Pacemaker Implantation   -- We will hold Eliquis the morning of the procedure       Total Time Today was 71 minutes in the following activities: Preparing to see the patient, Obtaining and/or reviewing separately obtained history, Performing a medically appropriate examination and/or evaluation, Counseling and educating the patient/family/caregiver, Ordering medications, tests, or procedures, Referring and communication with other health care professionals (when not separately reported) and Documenting clinical information in the electronic or other health record       Ms. Mosely was educated regarding plan of care. She was instructed to call our office with any questions or concerns, as well as to notify us  of any new or worsening symptoms. She verbalized understanding.   I appreciate the opportunity to participate in the care of your patient.  Please do not hesitate to contact me directly if you have any questions or further insights into her care.  I have scheduled her follow-up with me as needed.  DETAILED UPDATED PMHx:    -- 05/23/2019: Event Monitor: Underlying rhythm was sinus, first degree AV block. Bundle Branch Block/IVCD was present. Patient had a min HR of 28 bpm, max HR of 133 bpm, and avg HR of 75 bpm. Second Degree AV Block-Mobitz I (Wenckebach) was present with bradycardia.  Patient was sleeping and not symptomatic. Isolated SVEs were rare (<1.0%, 5649), and no SVE Couplets or SVE Triplets were present.  Isolated VEs were frequent (5.6%, 41823), VE Couplets were rare (<1.0%, 24), and VE Triplets were rare (<1.0%, 3). Ventricular Bigeminy and Trigeminy were present. No episodes of ventricular tachycardia or ventricular fibrillation. No episodes of significant pauses.  -- 05/23/2019: Regadenosine Thallium Stress Test: This study is probably abnormal with evidence of a small sized, mild intensity, fixed apical perfusion defect. Left ventricular systolic function is normal. There are no high risk prognostic indicators present. The pharmacologic ECG portion of the study is negative for ischemia.   -- 12/13/2023: Echo: Mild concentric hypertrophy. The Interventricular septum thickness is 1.2 cm and posterior wall thickness of 1.2 cm. The left ventricular systolic function is normal. The ejection fraction by Simpson's biplane method is 58%. There are no segmental wall motion abnormalities. The right ventricle is mildly dilated. The right ventricular systolic function is normal. Mitral Valve:  Mild to moderate regurgitation. There is moderate mitral annular calcification without stenosis. Severe tricuspid valve regurgitation. Aortic valve is calcified.  There is mild aortic regurgitation. Mild biatrial dilatation. No pericardial effusion.  -- 11/23/2023: OV (Dr. Maurice): She is now having more fatigue and swelling in her legs is gotten a little bit worse despite reduction in her amlodipine . Fortunately she has not had any episodes of lightheadedness or passing out. I do think she would probably benefit from pacemaker. I am going to obtain an echocardiogram and have her evaluated by EP. We did discuss warning signs of development of complete heart block or worsening bradycardia and she knows to seek emergency treatment if necessary.        Vitals:    12/15/23 1425   BP: (!) 149/78   BP Source: Arm, Left Upper   Pulse: 61   SpO2: 97%   O2 Device: None (Room air)   PainSc: Zero   Weight: 86.2 kg (190 lb)   Height: 172.7 cm (5' 8)     Body mass index is 28.89 kg/m?SABRA     Past Medical History  Patient Active Problem List    Diagnosis Date Noted    Chronic heart failure with preserved ejection fraction (HFpEF) (CMS-HCC) 07/09/2019    Atypical chest pain 04/27/2019    Cardiomegaly 04/27/2019    Trifascicular block 04/27/2019    PVC (premature ventricular contraction) 04/27/2019    Anxiety 04/26/2019    Benign essential hypertension 04/26/2019     04/25/2019 Chest xray: The cardiac silhouette is enlarged. Lungs are free of focal consolidations. Chronic interstitial changes are noted. Pulmonary vasculature is normal in caliber. The costophrenic angles are sharp. The bony structures are adequately mineralized.       Diverticulosis 04/26/2019    Gastroesophageal reflux disease 04/26/2019    High cholesterol 04/26/2019    Insomnia 04/26/2019    Overactive bladder 04/26/2019         Review of Systems   Constitutional: Negative.   HENT: Negative.     Eyes: Negative.    Cardiovascular: Negative.    Respiratory: Negative.     Endocrine: Negative.    Hematologic/Lymphatic: Negative.    Skin: Negative.  Musculoskeletal: Negative.    Gastrointestinal: Negative.    Genitourinary: Negative.    Neurological: Negative.    Psychiatric/Behavioral: Negative.     Allergic/Immunologic: Negative.        Physical Exam  Constitutional:  She is in no acute distress, resting comfortably.  Skin/Integument:  Warm and dry.  Eyes:  PERRL, sclera are non-icteric and no xanthelasmas noted.    ENT:  Hearing is intact.  Heme/Lym/Immun:  Supple neck, without thyromegaly.  Respiratory-Pulmonary/Chest:  Effort normal and breath sounds normal. No respiratory distress or accessory muscle use. No obvious tracheal deviation.  Clear to auscultation bilaterally.    Cardiovascular:  No evidence of increased jugular venous pressure, carotids are 2+/4+ equal bilaterally.  Irregularly irregular rhythm, S1, S2.  I do not appreciate any significant murmur today. No heaves, thrills or rubs.  Musc/Skeletal-Extremities:  Trace to 1+ pedal edema. With what appears to be full ROM.   Neuro:  Patient is alert and oriented to person, place, and time.   Psych:  Patient does not appear anxious, she appears appropriate, with normal non-pressured speech and what appears to be appropriate judgement    Cardiovascular Studies  ECG today demonstrates   Last results for 12-Lead ECG this visit   ECG 12-LEAD    Collection Time: 12/15/23  2:39 PM   Result Value Status    VENTRICULAR RATE 61 Corrected    P-R INTERVAL  Corrected    QRS DURATION 148 Corrected    Q-T INTERVAL 450 Corrected    QTC CALCULATION (BAZETT) 453 Corrected    P AXIS  Corrected    R AXIS -60 Corrected    T AXIS -11 Corrected    Impression    Atrial fibrillation  Right bundle branch block  Left anterior fascicular block  ** Bifascicular block **  Poor R-wave progression  When compared with ECG of 23-Nov-2023 15:06,  Significant changes have occurred  Reconfirmed by Maylene Lunger ([id number]) on 12/15/2023 3:04:20 PM        Cardiovascular Health Factors  Vitals BP Readings from Last 3 Encounters:   12/15/23 (!) 149/78   12/13/23 (!) 149/100   11/23/23 139/75     Wt Readings from Last 3 Encounters:   12/15/23 86.2 kg (190 lb)   12/13/23 86.2 kg (190 lb)   11/23/23 87.4 kg (192 lb 9.6 oz)     BMI Readings from Last 3 Encounters:   12/15/23 28.89 kg/m?   12/13/23 28.89 kg/m?   11/23/23 29.28 kg/m?      Smoking Social History     Tobacco Use   Smoking Status Never   Smokeless Tobacco Never      Lipid Profile Cholesterol   Date Value Ref Range Status   05/27/2023 209 (H) <200 Final     HDL   Date Value Ref Range Status   05/27/2023 52  Final     LDL   Date Value Ref Range Status   05/27/2023 139 (H) <100 Final     Triglycerides   Date Value Ref Range Status   05/27/2023 94  Final      Blood Sugar No results found for: HGBA1C  Glucose   Date Value Ref Range Status   05/27/2023 108 (H) 70 - 105 Final   09/16/2021 100  Final   08/22/2020 107 (H) 70 - 105 Final          Problems Addressed Today  Encounter Diagnoses   Name Primary?    Cardiovascular symptoms Yes  Current Medications (including today's revisions)   acetaminophen (TYLENOL) 500 mg tablet Every 4-6 Hours As Needed as needed for For Pain    apixaban (ELIQUIS) 5 mg tablet Take one tablet by mouth twice daily.    diclofenac sodium (VOLTAREN) 1 % topical gel Apply four g topically to affected area every 6 hours as needed.    Multivitamins with Fluoride (MULTI-VITAMIN PO) Take  by mouth.    MYRBETRIQ 50 mg ER tablet     nystatin (MYCOSTATIN) 100,000 unit/g topical cream Apply  topically to affected area twice daily.    polyethylene glycol 3350 (MIRALAX) 17 g packet Take one packet by mouth daily.          Documentation recorded by Timothy Fanous, acting as a Neurosurgeon for [employer].D.

## 2023-12-15 NOTE — Patient Instructions
 Please schedule an appointment with Dr. Maylene in after your procedure.  If you would like to call us  to make this appt, please call [phone].  The schedule is released approximately 4-5 months in advance.    We will plan for a Pacemaker implant with Dr. Maylene on Wednesday, 01/05/2024.   Hold Eliquis the morning of your procedure.     My name is Will Darryle, Charity fundraiser.    In order to provide you the best care possible we ask that you follow up as below:    Contacting our office:    -Business Hours: Monday-Friday, 8:00 am-4:30 pm (excluding Holidays).     -For medical questions or concerns, please send us  a message through your MyChart account or call the [facility] nursing triage line at [phone]. Please leave a detailed message with your name, date of birth, and reason for your call.  If your message is received before 3:30pm, every effort will be made to call you back the same day.  Please allow time for us  to review your chart prior to call back.     -For medication refills please start by contacting your pharmacy. You can also send us  a prescription question through your MyChart or call the nurse triage line above.     -Should you have an immediate need of the weekend/nights and holidays, please call our on-call triage line at [phone].    -Our fax number is [phone].    Results & Testing Follow Up:    -Please allow 10-15 business days for the results of any testing to be reviewed. Please call our office if you have not heard from a nurse within this time frame.    -Should you choose to complete testing at an outside facility, please contact our office after completion of testing so that we can ensure that we have received results.    Lab and test results:  As a part of the CARES act, starting 08/17/2019, some results will be released to you via mychart immediately and automatically.  You may see results before your provider sees them; however, your provider will review all these results and then they, or one of their team, will notify you of result information and recommendations.   Critical results will be addressed immediately, but otherwise, please allow us  time to get back with you prior to you reaching out to us  for questions.  This will usually take about 72 hours for labs and 5-7 days for procedure test results.      -You will receive a survey in the upcoming week from [employer] of [facility]. Your feedback is important to us , and helps us  continue to improve patient care and patient satisfaction.

## 2023-12-15 NOTE — Patient Instructions
 ELECTROPHYSIOLOGY PRE-ADMISSION INSTRUCTIONS    Patient Name: Misty Jordan  MRN#: [id number]  Date of Birth: 08/17/1927 (89 y.o.)  Today's Date: 12/15/2023    PROCEDURE:  You are scheduled for Implantation of a Leadless Pacemaker  with Dr. Gladis SQUIBB. Emert.      ARRIVAL TIME:  Please report to the [facility] admitting office on the ground floor of the [facility] on: Wednesday, 01/05/2024    The [facility] is located at [address] [place], [place]  [address]. Park in [employer] and enter through the  Main front doors to the hospital. The Heart Center is located on the right-hand side as soon as you walk in.    The EP Lab will call to notify you of your arrival time.  They will call on the business day prior to your procedure.  (If you have any questions regarding your arrival time for the Electrophysiology Lab, please call the EP Lab at [phone].)        PRE-PROCEDURE APPOINTMENTS:  Completed Office visit to obtain or update history and physical with Dr. Gladis MYRTIS Party at the following [place] [facility] clinic location: [facility]     8/15 at 1:30pm   Pre-Operative Assessment Clinic visit with Anesthesia Department located at [facility], [address], [place], [place] [address] and proceed to the clinic entrance.      8/15   Pre-Admission lab work: BMP, CBC, and Magnesium at your Pre-Operative Assessment Visit.          SPECIAL MEDICATION INSTRUCTIONS  If not taking oral or injectable GLP-1 medication: Nothing to eat or drink after 11p.m. the evening before your procedure. Take your prescription medications with a sip of water as instructed. No caffeine for 24 hours prior to your procedure.      Any new prescriptions will be sent to your pharmacy listed on file with us .     HOLD ALL over the counter vitamins or supplements on the morning of your procedure.     Anticoagulants: Do not take apixaban (Eliquis) on the morning of your procedure. --- DO NOT MISS ANY OTHER DOSES ---     Additional Instructions  If you wear CPAP, please bring your mask and machine with you to the hospital.    Take a bath or shower with anti-bacterial soap the evening before, or the morning of the procedure. We will give this to you at your office visit.     Bring photo ID and your health insurance card(s).    Arrange for a driver to take you home from the hospital.    Bring an accurate list of your current medications with you to the hospital (all meds and supplements taken daily).    Wear comfortable clothes and don't bring valuables, other than photo identification card, with you to the hospital.    Please pack a bag for an overnight stay.     For patients who are staying overnight on the Cardiovascular Treatment and Recovery Unit (CTR), no visitor(s) will be allowed to sleep at the bedside.    Please review your pre-procedure instructions and call the office at [phone] with any questions. You may ask to speak with any of Dr. Gladis SQUIBB. Emert's nurses. There are several of us  in the office that can assist you. For questions regarding post procedure care or restrictions please refer to the Your Care Instructions included in the  Implantation of a Leadless Pacemaker  Packet.     ALLERGIES  Allergies   Allergen Reactions    Penicillins HIVES       CURRENT MEDICATIONS  Outpatient Encounter Medications as of 12/15/2023   Medication Sig Dispense Refill    acetaminophen (TYLENOL) 500 mg tablet Every 4-6 Hours As Needed as needed for For Pain      apixaban (ELIQUIS) 5 mg tablet Take one tablet by mouth twice daily. 180 tablet 3    diclofenac sodium (VOLTAREN) 1 % topical gel Apply four g topically to affected area every 6 hours as needed.      Multivitamins with Fluoride (MULTI-VITAMIN PO) Take  by mouth.      MYRBETRIQ 50 mg ER tablet       nystatin (MYCOSTATIN) 100,000 unit/g topical cream Apply  topically to affected area twice daily.      polyethylene glycol 3350 (MIRALAX) 17 g packet Take one packet by mouth daily.       No facility-administered encounter medications on file as of 12/15/2023.     _________________________________________  Form completed by: Elsie Kotyk, RN  Date completed: 12/15/23  Method: Via MyChart.

## 2023-12-17 ENCOUNTER — Encounter
Admit: 2023-12-17 | Discharge: 2023-12-17 | Payer: MEDICARE | Primary: Student in an Organized Health Care Education/Training Program

## 2023-12-17 NOTE — Progress Notes
 Medicare is listed as patient's primary insurance coverage.  Pre-certification is not required for hospitalizations.

## 2023-12-20 ENCOUNTER — Encounter
Admit: 2023-12-20 | Discharge: 2023-12-20 | Payer: MEDICARE | Primary: Student in an Organized Health Care Education/Training Program

## 2023-12-20 NOTE — Telephone Encounter
 Spoke with Inocente, patient's daughter in law.  She states that Esta is doing okay.  She states that her swelling is stable and hasn't worsened.  She is scheduled for implantation of pacemaker on 8/20 with Dr. Maylene.

## 2023-12-20 NOTE — Telephone Encounter
-----   Message from LELON Schmitz, MD sent at 12/20/2023 10:48 AM CDT -----  Normal heart pump function but the tricuspid valve regurgitation appears severe.  Please let me know how she is doing from the lower extremity swelling standpoint.  We may need to adjust her   diuretics a little bit.  If she planning on getting pacemaker with Dr. Maylene?  ----- Message -----  From: Horacio Lords, MD  Sent: 12/13/2023   3:39 PM CDT  To: Elsie ONEIDA Schmitz, MD

## 2023-12-20 NOTE — Progress Notes
 Spoke to pt's daughter in law [place].  She state [place] is doing okay.  Her swelling is stable and not getting worse.  No complaints at this time.  [place] is scheduled for pacemaker placement on 8/20 with Dr. Maylene.    Will route to Dr. Maurice for review.

## 2023-12-31 ENCOUNTER — Encounter
Admit: 2023-12-31 | Discharge: 2023-12-31 | Payer: MEDICARE | Primary: Student in an Organized Health Care Education/Training Program

## 2023-12-31 ENCOUNTER — Ambulatory Visit
Admit: 2023-12-31 | Discharge: 2024-01-01 | Payer: MEDICARE | Primary: Student in an Organized Health Care Education/Training Program

## 2024-01-05 ENCOUNTER — Encounter
Admit: 2024-01-05 | Discharge: 2024-01-05 | Payer: MEDICARE | Primary: Student in an Organized Health Care Education/Training Program

## 2024-01-05 MED ORDER — NOREPINEPHRINE IV DRIP D5W STD CONC (AM)(OR)
INTRAVENOUS | 0 refills | Status: DC
Start: 2024-01-05 — End: 2024-01-05
  Administered 2024-01-05: 14:00:00 .05 ug/kg/min via INTRAVENOUS

## 2024-01-05 MED ORDER — PROPOFOL 10 MG/ML IV EMUL 50 ML (INFUSION)(AM)(OR)
INTRAVENOUS | 0 refills | Status: DC
Start: 2024-01-05 — End: 2024-01-05
  Administered 2024-01-05: 13:00:00 80 ug/kg/min via INTRAVENOUS

## 2024-01-05 MED ORDER — ONDANSETRON HCL (PF) 4 MG/2 ML IJ SOLN
INTRAVENOUS | 0 refills | Status: DC
Start: 2024-01-05 — End: 2024-01-05

## 2024-01-05 MED ORDER — FENTANYL CITRATE (PF) 50 MCG/ML IJ SOLN
INTRAVENOUS | 0 refills | Status: DC
Start: 2024-01-05 — End: 2024-01-05

## 2024-01-05 MED ORDER — LIDOCAINE (PF) 200 MG/10 ML (2 %) IJ SYRG
INTRAVENOUS | 0 refills | Status: DC
Start: 2024-01-05 — End: 2024-01-05

## 2024-01-05 MED ORDER — HEPARIN (PORCINE) 1,000 UNIT/ML IJ SOLN
INTRAVENOUS | 0 refills | Status: DC
Start: 2024-01-05 — End: 2024-01-05

## 2024-01-05 MED ADMIN — VANCOMYCIN 1.25 GRAM IV SOLR [338455]: 1250 mg | INTRAVENOUS | @ 13:00:00 | Stop: 2024-01-05 | NDC 67457082312

## 2024-01-05 MED ADMIN — APIXABAN 5 MG PO TAB [315778]: 5 mg | ORAL | @ 21:00:00 | NDC 00003089431

## 2024-01-05 MED ADMIN — SODIUM CHLORIDE 0.9% IV SOLP [27838]: 1250 mg | INTRAVENOUS | @ 13:00:00 | Stop: 2024-01-05 | NDC 00338004902

## 2024-01-05 MED ADMIN — SODIUM CHLORIDE 0.9% IV SOLP [27838]: INTRAVENOUS | @ 12:00:00 | Stop: 2024-01-05 | NDC 00338004904

## 2024-01-05 NOTE — Progress Notes
 New patient was enrolled into [employer] remote monitoring. New patient welcome letter and agreement were sent. Remote monitoring schedule was entered into patient chart for future device checks. Next remote device check is scheduled for 04/05/24.  BC

## 2024-01-06 ENCOUNTER — Encounter
Admit: 2024-01-06 | Discharge: 2024-01-06 | Payer: MEDICARE | Primary: Student in an Organized Health Care Education/Training Program

## 2024-01-25 ENCOUNTER — Encounter
Admit: 2024-01-25 | Discharge: 2024-01-25 | Payer: MEDICARE | Primary: Student in an Organized Health Care Education/Training Program

## 2024-01-30 ENCOUNTER — Encounter
Admit: 2024-01-30 | Discharge: 2024-01-30 | Payer: MEDICARE | Primary: Student in an Organized Health Care Education/Training Program

## 2024-03-28 ENCOUNTER — Encounter
Admit: 2024-03-28 | Discharge: 2024-03-28 | Payer: MEDICARE | Primary: Student in an Organized Health Care Education/Training Program

## 2024-03-29 ENCOUNTER — Encounter
Admit: 2024-03-29 | Discharge: 2024-03-29 | Payer: MEDICARE | Primary: Student in an Organized Health Care Education/Training Program

## 2024-04-04 ENCOUNTER — Encounter
Admit: 2024-04-04 | Discharge: 2024-04-04 | Payer: MEDICARE | Primary: Student in an Organized Health Care Education/Training Program

## 2024-04-08 ENCOUNTER — Encounter
Admit: 2024-04-08 | Discharge: 2024-04-08 | Payer: MEDICARE | Primary: Student in an Organized Health Care Education/Training Program

## 2024-04-11 ENCOUNTER — Encounter
Admit: 2024-04-11 | Discharge: 2024-04-11 | Payer: MEDICARE | Primary: Student in an Organized Health Care Education/Training Program

## 2024-04-11 DIAGNOSIS — I10 Essential (primary) hypertension: Secondary | ICD-10-CM

## 2024-04-11 DIAGNOSIS — I493 Ventricular premature depolarization: Secondary | ICD-10-CM

## 2024-04-11 DIAGNOSIS — E7800 Pure hypercholesterolemia, unspecified: Secondary | ICD-10-CM

## 2024-04-11 DIAGNOSIS — I5032 Chronic diastolic (congestive) heart failure: Secondary | ICD-10-CM

## 2024-04-11 DIAGNOSIS — Z136 Encounter for screening for cardiovascular disorders: Principal | ICD-10-CM

## 2024-04-11 DIAGNOSIS — I453 Trifascicular block: Secondary | ICD-10-CM

## 2024-05-19 ENCOUNTER — Encounter
Admit: 2024-05-19 | Discharge: 2024-05-19 | Payer: MEDICARE | Primary: Student in an Organized Health Care Education/Training Program

## 2024-05-19 MED ORDER — POTASSIUM CHLORIDE 10 MEQ PO TBER
ORAL_TABLET | 3 refills | 30.00000 days | Status: AC
Start: 2024-05-19 — End: ?

## 2024-06-15 ENCOUNTER — Encounter
Admit: 2024-06-15 | Discharge: 2024-06-15 | Payer: MEDICARE | Primary: Student in an Organized Health Care Education/Training Program

## 2024-06-15 ENCOUNTER — Ambulatory Visit
Admit: 2024-06-15 | Discharge: 2024-06-15 | Payer: MEDICARE | Primary: Student in an Organized Health Care Education/Training Program
